# Patient Record
Sex: Female | Born: 1970 | Race: White | Hispanic: Refuse to answer | Marital: Single | Smoking: Never smoker
Health system: Western US, Academic
[De-identification: ages and names within clinical notes are randomized; demographics above are authoritative.]

## PROBLEM LIST (undated history)

## (undated) ENCOUNTER — Emergency Department (HOSPITAL_BASED_OUTPATIENT_CLINIC_OR_DEPARTMENT_OTHER): Admission: EM | Payer: PRIVATE HEALTH INSURANCE | Source: Home / Self Care

## (undated) DIAGNOSIS — F419 Anxiety disorder, unspecified: Secondary | ICD-10-CM

## (undated) DIAGNOSIS — Z5189 Encounter for other specified aftercare: Secondary | ICD-10-CM

## (undated) DIAGNOSIS — D649 Anemia, unspecified: Secondary | ICD-10-CM

## (undated) DIAGNOSIS — R011 Cardiac murmur, unspecified: Secondary | ICD-10-CM

## (undated) HISTORY — DX: Anemia, unspecified: D64.9

## (undated) HISTORY — DX: Anxiety disorder, unspecified: F41.9

## (undated) HISTORY — PX: BREAST REDUCTION SURGERY: SHX8

## (undated) HISTORY — DX: Cardiac murmur, unspecified: R01.1

## (undated) HISTORY — DX: Encounter for other specified aftercare: Z51.89

---

## 1997-07-30 ENCOUNTER — Emergency Department (HOSPITAL_COMMUNITY): Admission: EM | Admit: 1997-07-30 | Discharge: 1997-07-30 | Payer: Self-pay | Admitting: Emergency Medicine

## 1997-09-02 ENCOUNTER — Other Ambulatory Visit: Admission: RE | Admit: 1997-09-02 | Discharge: 1997-09-02 | Payer: Self-pay | Admitting: Obstetrics & Gynecology

## 1999-07-08 ENCOUNTER — Other Ambulatory Visit: Admission: RE | Admit: 1999-07-08 | Discharge: 1999-07-08 | Payer: Self-pay | Admitting: Obstetrics & Gynecology

## 1999-11-19 ENCOUNTER — Encounter (INDEPENDENT_AMBULATORY_CARE_PROVIDER_SITE_OTHER): Payer: Self-pay | Admitting: Specialist

## 1999-11-19 ENCOUNTER — Other Ambulatory Visit: Admission: RE | Admit: 1999-11-19 | Discharge: 1999-11-19 | Payer: Self-pay | Admitting: Plastic Surgery

## 2000-07-08 ENCOUNTER — Other Ambulatory Visit: Admission: RE | Admit: 2000-07-08 | Discharge: 2000-07-08 | Payer: Self-pay | Admitting: Obstetrics & Gynecology

## 2001-08-01 ENCOUNTER — Other Ambulatory Visit: Admission: RE | Admit: 2001-08-01 | Discharge: 2001-08-01 | Payer: Self-pay | Admitting: Obstetrics & Gynecology

## 2001-12-03 ENCOUNTER — Encounter: Payer: Self-pay | Admitting: Emergency Medicine

## 2001-12-04 ENCOUNTER — Observation Stay (HOSPITAL_COMMUNITY): Admission: AD | Admit: 2001-12-04 | Discharge: 2001-12-05 | Payer: Self-pay | Admitting: Obstetrics and Gynecology

## 2002-02-14 ENCOUNTER — Ambulatory Visit: Admission: RE | Admit: 2002-02-14 | Discharge: 2002-02-14 | Payer: Self-pay | Admitting: Obstetrics and Gynecology

## 2002-03-07 ENCOUNTER — Ambulatory Visit: Admission: RE | Admit: 2002-03-07 | Discharge: 2002-03-07 | Payer: Self-pay | Admitting: Obstetrics and Gynecology

## 2002-03-07 ENCOUNTER — Encounter (INDEPENDENT_AMBULATORY_CARE_PROVIDER_SITE_OTHER): Payer: Self-pay

## 2002-07-19 ENCOUNTER — Emergency Department (HOSPITAL_COMMUNITY): Admission: EM | Admit: 2002-07-19 | Discharge: 2002-07-19 | Payer: Self-pay | Admitting: Emergency Medicine

## 2004-05-10 ENCOUNTER — Emergency Department (HOSPITAL_BASED_OUTPATIENT_CLINIC_OR_DEPARTMENT_OTHER): Admitting: Emergency Medicine

## 2004-05-10 ENCOUNTER — Emergency Department (INDEPENDENT_AMBULATORY_CARE_PROVIDER_SITE_OTHER): Payer: Self-pay

## 2004-05-10 NOTE — ED Notes (Signed)
============================== ADMIT SUMMARY ==============================    RECEIVING NURSE -      ED NURSE -     +------------------------------- ALLERGIES -------------------------------+   Sulpha    +-------------------------- ADMITTING DIAGNOSIS --------------------------+   allergic reaction    +--------------------------- ADMITTING SERVICE ---------------------------+    +------------------------ MOST RECENT VITAL SIGNS ------------------------+  BP - 105/62 PULSE - 100   RESPIRATIONS - 18 O2 SAT - 98   TEMPERATURE - MODE -   GCS TOTAL - 15   PAIN - 0 PAIN QUALITY - N/A   PAIN LOCATION - n/a     +-------------------------------- FLUIDS ---------------------------------+  DATE TIME IV FLUID L/R LOCATION SIZE HUNG ABSORBED  ---------------------------------------------------------------------------     TOTAL IV: 0 ml    TOTAL OUTPUT: 0 ml TOTAL PO: 0 ml    +------------------------------ MEDICATIONS ------------------------------+  DATE TIME MEDICATION VERIFYING RN RN INIT  ---------------------------------------------------------------------------    04/16 0507 DIPHENHYDRAMINE 50 Milligrams TLC   PO-(TAB)  04/16 0640 FAMOTIDINE 20 Milligrams PO TLC    +------------------------------- LABS DONE -------------------------------+  ACT- MD MD RN AP +INITIALS+  IVE DATE TIME TIME TIME TREATMENT ORDERS MD RN AP   ---------------------------------------------------------------------------      EKG DONE - NO    +---------------------------- PROCEDURE NOTES ----------------------------+    +------------------------ OTHER NURSING PROCEDURES -----------------------+     +--------------------------- PSYCHOSOCIAL NEEDS --------------------------+  +------------------------- BARRIERS TO LEARNING --------------------------+    ASSESSMENT- Assessment Done with Findings of:      BARRIERS-    No Barriers  SUPPORT PERSON- HUSBAND      SPECIAL CONSIDERATIONS-      ============================== TRIAGE RECORD  ==============================    CHIEF COMPLAINT- Allergic Reaction    TIME OF ONSET- 1 DAY : +-STANDING-+ +--SEATED--+  TRIAGE CATEGORY- 2 : BP PULSE BP PULSE   ROOM- T1 : N/A/N/A N/A 128/81 101  MODE OF ARRIVAL- Car :   IN CUSTODY- No : TEMP MODE O2SAT RESP LMP   PRIVATE MD- M. LOOK : 98.1 Oral 100 16 03/19/04       :   WORK RELATED INJURY- No : +--GCS--+ +--PUPILS--+  RETURN IN 72 HOURS- None : E V M TOT L R RESPONSE  TRIAGE NURSE- Dave Flores : 4 5 6 15  X X N/A     IS THIS VISIT RELATED TO ASSAULT OR DOMESTIC VIOLENCE- NO   PAIN TYPE- V NOW- 7 TOLERABLE AT- 4 QUALITY- Constant      PAIN LOCATION- BIL AXILLA RADIATES TO- BODY   LATEX ALLERGY FORM- No LATEX ALLERGY- No TETANUS- N/A   IMMUNIZATION- UTD PED HEIGHT- N/A WEIGHT- N/A KG  ADDITIONAL FORMS- No   +------------------------- CARE PRIOR TO ARRIVAL -------------------------+   TOOK CLARITIN  +------------------------------- ALLERGIES -------------------------------+   Sulpha  +------------------------------ MEDICATIONS ------------------------------+   PRE-NATAL VIT, CLARITIN, AVENO  +-------------------------- PAST MEDICAL HISTORY -------------------------+   ALLERGIC REACTION TO SULFA GAVE HER THE SAME REACTION.  +---------------------------- CURRENT HISTORY ----------------------------+   C/O EYES SWELLING, RASH TO HER ENTIRE BODY. STATED SHE IS 9.[redacted] WKS   PREGNANT. DENIES OF SOB. C/O BEING UNABLE TO SLEEP.    =========================== REASSESSMENT VITALS ===========================   R T M I   E E O N   S M D O2 PUPILS +---GCS----+ I  DATE TIME BP HR P P E SAT L R E V M TOT POSITION T  ---------------------------------------------------------------------------    04/16 0445 128/81 101 16 98.1 O 100 4 5 6 15  Seated DXF  04/16 0445 / Standing DXF  04/16 0705  105/62 100 18 98 4 5 6 15  Lying TLC     +-----------------PAIN-----------------+   T       Y N T       P O O      DATE TIME E W L LOCATION QUALITY RADIATES COMMENT       ---------------------------------------------------------------------------    04/16 0445 V 7 4 BIL AXILLA Constant BODY Triage  04/16 0445 Triage  04/16 0705 V 0 0 n/a N/A n/a     ============================= NURSE DISCHARGE =============================    DISCHARGE NURSE- French Ana Contizano   DISPOSITION- Seen & Treated WITH- Family   ACCOMPANIED BY- N/A   EQUIP W/TRANSPORT-    N/A   TIME OF DISPOSITION- 05/10/2004 1610 LEFT ED VIA- Ambulate   TEANSFERRED TO- N/A REASON- N/A   ADMITTED TO- N/A ROOM- N/A   NURSE REPORT TO- N/A REPORT TIME- N/A   BELONGINGS- With Patient ENVELOPE NUMBER- N/A   CONDITION ON DISCHARGE- Stable   AFTERCARE PROVIDED WITH- Written and Verbal   WHAT AFTERCARE INSTRUCTIONS WERE GIVEN AND REVIEWED WITH PATIENT  AND/OR FAMILY?- (see EPIC instructions)   allergic dermatitis, benadryl, pepcid   IN WHAT LANGUAGE WERE THESE GIVEN?- English OTHER: N/A   TRANSLATED BY- N/A OTHER: N/A   GCS- E: 4 V: 5 M: 6 TOTAL: 15   PAIN LEVEL UPON DISPOSITION- 0 OUT OF 10  EXPECTED OUTCOMES MET- Yes   WHAT MEDS WERE PROVIDED FROM DISCHARGE PYXIS?-    N/A   RX TO BE FILLED FOR- N/A   DID THE PATIENT OR RESPONSIBLE CARE PROVIDER UNDERSTAND THE FOLLOW UP  RECOMMENDATION?- Yes DISPOSITION BY- RN        ============================== POINT OF CARE ==============================    OCCULT BLOOD STOOL RESULTS   Norm results neg.  DATE TIME RESULTS DONE BY CONTROL POSTIVE CONTROL NEGATIVE     URINE PREGNANCY TEST   Norm results for non pregnant females neg.  DATE TIME RESULTS DONE BY     URINE DIP   Norm results - All neg. with pH 4.6 to 8.0 and urobili 0.1 to 1.0   LEUKO NI- PRO- GLU- URO-   DATE TIME CYTE TRITE PH TEIN COSE KETONES BILI BILI BLOOD BY     FINGER STICK GLUCOSE   Norm results 60 to 110 mg/dl  DATE TIME RESULTS DONE BY     FINGER STICK HEMOGLOBIN   Norm results adult female 59 to 17 gm/dl  Norm results adult female 25 to 16 gm/dl  DATE TIME RESULTS DONE BY     ============================== MD NOTES H&P  ===============================    TIME OF NOTE WRITTEN- N/A      CHIEF COMPLAINT- N/A        HISTORY OF PRESENT ILLNESS  04/16 0517 Marcy Salvo, MD Attending     34 yo f at 7.[redacted] wks ega comes to ed for eval of rash. states   has been progressively worse since yesterday. red, raised,   itchy. now on almost all parts of body. palms/soles/mucous   membranes spared. no fevers. no problems breathing. no new   medications. no new soaps/topical products. no ill contacts.   has taken two doses of claritin as instructed by md in last   24 hours w/o relief. also using aveeno baths w/o relief.      PAST MEDICAL/SURGICAL HISTORY  04/16 0517 Marcy Salvo, MD Attending     allergic to sulfa      FAMILY HISTORY- not elicited   SOCIAL  HISTORY- married   OTHER- not elicited   REVIEW OF SYSTEMS- All other systems reviewed and are negative.     PHYSICAL EXAM  04/16 0517 Marcy Salvo, MD Attending     vs noted.   o2 sat 100% on ra, nl.   nad.   op clear.   neck supple.   ctab.   maculopapular rash scattered about entire body. most   prominent across upper back/shoulders, eyelids, antecubital   fossae.      IMPRESSION  04/16 0517 Marcy Salvo, MD Attending     allergic reaction, unknown cause. no respiratory compromise.      MEDICAL DECISION MAKING  04/16 0517 Marcy Salvo, MD Attending     will give benadryl and reassess.    CASE PRESENTED TO- Marcy Salvo     ============================= PHYSICIAN NOTES =============================    04/16 9147 Marcy Salvo, MD Attending     pt w/ mild improvement of rash, mild improvement of itching,   able to get some sleep while here. no progression of sx. no   respiratory sx. will d/c home to continue benadryl. may take   another dose of pepcid tomorrow if not improved. to f/u w/   pmd.      ============================= PROCEDURE NOTES =============================

## 2004-05-10 NOTE — ED Notes (Signed)
=================================   ORDERS ==================================    ACT- MD MD RN AP +INITIALS+  IVE DATE TIME TIME TIME TREATMENT ORDERS MD RN AP   ---------------------------------------------------------------------------     04/16 0506 0507 0525 DIPHENHYDRAMINE 50 Milligrams MH TLC KSW   PO-(TAB)   04/16 0638 0639 FAMOTIDINE 20 Milligrams PO MH TLC    04/16 0702 0703 d/c home MH TLC    dx allergic reaction   benadryl 50 mg q8h prn (over   thecounter)   pepcid daily (over the counter) if   not better tomorrow   f/u pmd   04/16 0702 0703 Discharge MH TLC

## 2004-05-10 NOTE — ED Notes (Signed)
================================   LAB NOTES ================================      ================================= IMAGES ==================================      ============================== MD DISCHARGE ===============================    DISCHARGE PHYSICIAN- Marcy Salvo   CHIEF COMPLAINT- N/A CASE PRESENTED TO- Marcy Salvo   CONDITION OF DISCHARGE- Stable   WAS THIS VISIT FOR A WORK RELATED ILLNESS OR INJURY- No      PRIMARY CARE PHYSICIAN- MICHELLE LOOK   HAS PCP BEEN CONTACTED- No H&P NOTE WAS DICTATED- No   DISCHARGE DIAGNOSIS-    allergic reaction     DISCHARGE INSTRUCTIONS    04/16 0702 Adela Glimpse, RN     PHYSICIAN- Marcy Salvo, MD Attending FOLLOW-UP (DAYS)- as needed    APPOINTMENT- No RETURN TO- N/A LANGUAGE- English    INSTRUCTIONS-   ALLERGIC DERMATITIS    MEDICATIONS-   ANTIHISTAMINES    GASTRIC ACID BLOCKERS    REFERRAL CLINICS-   REFERRAL PHYSICIANS-   ADDITIONAL INSTRUCTIONS-   Follow up with primary doctor if symptoms persist. Return to   ER if   symptoms worsen.      ============================= FOLLOW UP NOTES =============================

## 2004-05-10 NOTE — ED Notes (Signed)
==============================   ATTENDING NOTE =============================    04/16 0703 Marcy Salvo, MD Attending     see h&p

## 2004-07-14 ENCOUNTER — Other Ambulatory Visit (HOSPITAL_BASED_OUTPATIENT_CLINIC_OR_DEPARTMENT_OTHER): Payer: Self-pay | Admitting: Specialist

## 2004-07-23 ENCOUNTER — Encounter (INDEPENDENT_AMBULATORY_CARE_PROVIDER_SITE_OTHER): Payer: Self-pay | Admitting: Obstetrics & Gynecology

## 2004-07-31 NOTE — Progress Notes (Signed)
 CLINIC: REPRODUCTIVE MEDICINE    REPORT TYPE: CONSULT    Dictating Practitioner: Glenard Haring. Christell Constant, M.D.    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 Naponic, M.D.  [address]  [place], [place] [address]    Dear Dr. Atilano Median:    We saw Carrie Ellison today for the above indication. 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 This gave a  Down syndrome risk of 1 in 50 with a trisomy 18 risk of less than 1 in  10,000.    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 After appropriate counseling the  patient requested amniocentesis and this was performed without incident.  The patient elected FISH evaluation that has returned negative for  trisomies 79, 18 and 13. The final amniocentesis result from cell culture  is not yet available.    I met with this family today to discuss the prognosis with cystic hygroma  and to outline their options.    The outline of the data provided to them in a 40 minute discussion included  the following:  1. 60 to 70% of cases of cystic hygroma are associated with aneuploidy.  2. Of the euploid cases with cystic hygroma, 2/3 are associated with a   major structural abnormality.  3. Of the remaining euploid infants with structural abnormalities, at least   2/3 die in utero or in the neonatal period.    Thus the intact survival likelihood with a fetus with cystic hygroma is  approximately 5%.    However, because of the high likelihood of the karyotype returning normal  and while a fetal cardiac echo has not yet been completed, it is fairly  likely there is no structural abnormality, this leaves her with  approximately a 30% chance of intact survival.  1. However, of the "intact survivors," an unknown percentage are associated   with Noonan's syndrome which involves pulmonary stenosis and   hypertension, hearing deficits  and IQ deficits. The average IQ of   Noonan's children is approximately 63 although a fair proportion are in   the normal range.  2. Approximately 1/2 of Noonan's syndrome children can be diagnosed from a   specific gene defect and should the chromosomes return normal from the   amniocentesis, we will work the patient up for this as well as 18q-   deletion.  3. Thus, even given the approximately 30% chance of intact survival with   normal structure and karyotype, the chance of developmental delay is   significant.    At the conclusion of my visit with Carrie Ellison and her husband today, they  indicated that they may consider pregnancy termination. To assist them  with their decision making, I have asked them to return in approximately  one week whereupon we will reassess the size of the nuchal cystic structure  and in combination with the completed karyotype provide final counseling.      A fetal cardiac echogram has been tentatively scheduled for approximately  July 20.    I left a message in your office today. I am hoping to speak with you  tomorrow regarding this matter.    Sincerely,      Job #: [id number]          Signature Derived From Controlled Access Password  [place] R. Christell Constant,  M.D. 07/31/2004 08:27      DD: 07/23/2004 DT: 07/25/2004 1:00 P DocNo.: [id number]  [id number]      Primary Care Physician:  Lelon Mast  [address], [place] [address]    cc:

## 2004-08-05 ENCOUNTER — Ambulatory Visit (HOSPITAL_BASED_OUTPATIENT_CLINIC_OR_DEPARTMENT_OTHER)

## 2004-08-05 ENCOUNTER — Other Ambulatory Visit (INDEPENDENT_AMBULATORY_CARE_PROVIDER_SITE_OTHER): Payer: Self-pay | Admitting: Obstetrics

## 2004-08-06 ENCOUNTER — Ambulatory Visit (HOSPITAL_BASED_OUTPATIENT_CLINIC_OR_DEPARTMENT_OTHER)

## 2004-08-07 ENCOUNTER — Ambulatory Visit

## 2004-08-07 ENCOUNTER — Encounter (INDEPENDENT_AMBULATORY_CARE_PROVIDER_SITE_OTHER): Payer: Self-pay | Admitting: Obstetrics & Gynecology

## 2004-08-10 NOTE — Op Note (Signed)
 Dictating Practitioner: Houston Siren, M.D.     Staff Physician: Christell Constant. Woelkers, M.D.    Date of Operation: 08/07/2004    PREOPERATIVE DIAGNOSIS: 1 Intrauterine pregnancy at 20+1 weeks; 2. Fetal  cystic hygroma; 3. Desires elective pregnancy termination  POSTOPERATIVE DIAGNOSIS: 1 Intrauterine pregnancy at 20+1 weeks; 2. Fetal  cystic hygroma; 3. Desires elective pregnancy termination  OPERATION: Ultrasound-guided dilation and evacuation.  SURGEON/STAFF: D. Woelkers ASSISTANT: G. Reggiardo    ANESTHESIA: General endotracheal.  ESTIMATED BLOOD LOSS: 200 ml.  FLUIDS: 2300 ml of lactated Ringer's.    INDICATIONS: A 34 year old gravida 1 at 20 weeks and 1 day with a fetus  with a cystic hygroma. The patient has had a karyotype by [facility] which had  been negative but, given the high incidence of adverse outcomes with this  malformation, the family had elected for termination.    DESCRIPTION OF PROCEDURE: The patient was taken to the operating room  where general endotracheal intubation was placed and found to be adequate.  The patient was examined. Two sponges and three large laminaria and five  small laminaria were collected from the vagina. The vagina was then  prepped in the normal sterile fashion.    Under ultrasound guidance, a speculum was inserted and the anterior lip of  the cervix was grasped with a single-tooth tenaculum. A paracervical block  was applied with 0.5% lidocaine 10 ml and 8 units of pitressin. The  uterine cervix was dilated under direct visualization with an 18 Hegar. A  16 mm cannula was used to attempt to rupture the membrane. Inadequate  suction was observed and Sopher forceps were used to then rupture the  membrane. The cannula was then used to evacuate the fluid contents of the  uterus. Sopher forceps were then used under ultrasound guidance to  directly observe the removal of the products of conception. A sharp curet  was then used to curettage the inside of the endometrium and  remove all  remaining placental products. Uterine cry was obtained and the endometrium  had a good stripe at the end of the procedure.    All instruments were removed. The uterus was massaged and the patient was  given Pitocin. Again the speculum was inserted and the cervix was  visualized with no lacerations or damage. The uterus was once again  massaged and all instruments were removed.    The patient will be discharged with Methergine 0.2 mg p.o. q.4h. p.r.n.  bleeding; doxycycline 100 mg b.i.d. x3 days; ibuprofen 600 mg q.6-8h.  p.r.n., which the patient already has; and the patient also has a previous  order for Vicodin, which she has been using minimally since the laminaria  placement. The patient will follow up in six weeks and was given  precautions.    All sponge, lap and needle counts were correct x2. Dr. Marlynn Perking, the  Attending, was present and scrubbed in for the entire procedure.                Signature Derived From Controlled Access Password  Douglas A. Woelkers, M.D. 08/10/2004 08:35    DD: 08/07/2004 DT: 08/07/2004 9:59 A DocNo.: [id number]  GER/cec       Primary Care Physician:   Lelon Mast   [address], [place] [address]    cc:

## 2004-09-07 NOTE — Lab (Addendum)
PRIMARY:  PREMATURE DISRUPTED FEMALE FETUS ESTIMATED GESTATIONAL AGE [redacted] WEEKS  CYSTIC HYGROMA   PLACENTA - EDEMATOUS VILLI  CLINICAL SUMMARY:  This is a collection of fetal parts received after elective termination of  a 19-week gestation for cystic hygroma. The mother is 68 years old,  unknown gravida status. Dating is by last menstrual period. Cystic  hygroma was initially diagnosed 07/16/04 and was again seen on ultrasound  07/30/04. The fetus was identified as female, which was subsequently  confirmed by karyotyping: normal 46XX fetus.  CLINICO-PATHOLOGICAL CORRELATION:  There are relatively few pathologic findings which attest to fetal  compromise. The only abnormal finding is edematous villi within the  placenta and mild evidence of fetal hydrops, with an absence of normal  nucleated RBCs within the fetal vessels of the placenta. The cause of the  cystic hygroma in this case is not known and there is no evidence of Turner  syndrome or other chromosomal anomalies. The karyotype showed normal 46XX.  Other causes of hydrops include Rh factor isoimmunization,  hemoglobinopathies, cardiac or other AV malformations, chromosomal  abnormalities, and intrauterine infections including CMV and Parvovirus  B19. The mother is Rh+, and there is no familial hx of G6PD or thalassemia.  There was no grossly observed cardiac, pulmonary, or AV malformation and  all organs appear to be in their gestational age appropriate stage of  development. The edematous villi indicate likely placental compromise, but  there is no evidence of chorioamnionitis. Furthermore there is no evidence  of CMV or parvovirus infection, with an absence of nuclear or cytoplasmic  inclusions in either the placental tissue or fetal organs and normal  extramedullary hematopoiesis.   1) Dewaine Conger Perinatal Pathology 2nd ed. [place], [place].  [employer] 1996, pp. [phone], 120- 124, 131-157.  This case was presented at [facility] on  August 12, 2004  and consulted with Dr. Hermelinda Medicus.  Weights and Measurements  It is most reliable to give proportionate weight for infants' organs by  relying upon the crown-rump (CR) measurements, the next most reliable is to  use the foot length measurements, and the least reliable are standards  derived from the infant's weight. The standards here printed rely upon the  data given for that parameter marked in the left column. Data compared  with table or E. L. Potter & J. M.G. Tasia Catchings, Pathology of the Fetus and The  Infant, Year [employer], [employer]. [phone]. Cord length from:  R.L.Naeye: Umbilical cord length: Clinical Significance. J. Ped.  782:956-213, 1985. Placental weight from: D. Everardo Pacific and Purcell Mouton:  Tolerance Limits for Fetal and Placental Growth Relationships. J. Sela Hua. Brit. Cwlth. 78:620-623,1971.   Expected Actual  CR length 15.0 cm  CH length 22.0 cm  Foot length 2.9 cm 2.8 cm  Weight 200.0 g 152.0 g  Gestational age [redacted] wks  Heart 1.7 g  Lungs 7.2 g  Liver 12.0 g  Spleen 0.4 g  Adrenals (2) 1.1 g  Kidneys (2) 2.2 g  Thymus 0.4 g  Brain 33.0 g  Small bowel  Large bowel  Placenta number  Placenta weight 155.0 g 85.0 g  Cord length 31.0 cm 19.0 cm  Head circumference  Chest circumference  Abdomen circumference  Inner canthal distance     GROSS DESCRIPTION:  EXTERNAL EXAMINATION:  Multiple fetal parts are received. These weigh 152gm in aggregate. The  foot length is 2.8 cm. The left leg and foot; the right leg, foot, and  right hemipelvis;  the right forearm and hand; and the left arm and shoulder  are identified. There are normally formed five fingers and toes without  syndactyly. There is no single palmar crease. The right face and cranial  vault is identified. No ears are identified. A normally formed nose and  single eye is identified. Multiple rib and thoracic sections are  identified. A section of abdominal tissue is identified. There is a  patent anus. No external genitalia  are identified.  INTERNAL EXAMINATION:  CARDIOVASCULAR SYSTEM:  A hemisected heart is identified with normal red-brown myocardium.  RESPIRATORY SYSTEM:  The larynx is identified. A single tan-pink lung is identified.  HEMATOPOIETIC SYSTEM:  A red-brown spleen and an attached tan-yellow lobulated pancreas is  identified.  GASTROINTESTINAL SYSTEM:  The stomach is identified. Multiple lengths of small and large bowel are  identified.  HEPATOBILIARY SYSTEM:  A disrupted section of deep red and soft liver is identified. No  gallbladder identified.  REPRODUCTIVE SYSTEM:  An apparently normal uterus is identified. No external genitalia are  identified.  URINARY SYSTEM:  A single lobulated pink kidney is identified.  ORGANS OF THE NECK:  The thymus is identified and is yellow-tan and lobulated.  PLACENTA:  85 grams in aggregate of disrupted placental tissue is identified. 19 cm  of umbilical cord is identified. On cross sectioning, this reveals a three  vessel cord.  CASSETTE SUMMARY:  A1. Thymus, Spleen, Pancreas  A2. Placenta, Lung  A3. Placenta  A4. Placenta, Umbilical Cord, Bowel  A5. Liver  A6. Larynx  A7. Stomach, Kidney  A8. Rib  A9. Bladder  A10. Eye  MICROSCOPIC DESCRIPTION:  CARDIOVASCULAR SYSTEM:  Sections of the heart reveal unremarkable myocardium, and heart chambers  are filled with blood. A small number of which contain nucleated red cells  consistent with fetal blood. There is normal glycogenation of myocardial  cells.  RESPIRATORY SYSTEM:  Sections of lung reveal hypoplastic alveoli. There are no air-filled  spaces. This is consistent with a canalicular stage of development. The  bronchioles contain no polymorphonuclear cell or necrotic debris.  GASTROINTESTINAL SYSTEM:  Sections from the esophagus show no erosion and continue to have  pseudociliated cell of embryologic origin. Sections from the stomach  reveal normal mucosa and absence of polymorphonuclear cells. Sections from  small and large intestine  reveal normal mucosa.  HEPATOBILIARY SYSTEM:  Sections from liver demonstrate extramedullary hematopoiesis and no  significant histopathology.  PANCREAS:  Sections of the pancreas show unremarkable ducts and acinar structures.  They are normally formed islets of Langerhans, which are not enlarged and  do not abut.  URINARY SYSTEM:  Sections of kidney demonstrate normal subcortical glomerulopoiesis. Renal  tubules and renal pelves are unremarkable. The bladder reveals normal  mucosa.  HEMATOPOIETIC SYSTEM:  Sections of thymus and spleen show no significant histopathology.  REPRODUCTIVE SYSTEM:  A small section of uterus is identified microscopically and is normal.  There is a small section of normal fallopian tube with remnants of the  Wolffian and duct identified. No ovary is identified.  ENDOCRINE SYSTEM:  No pituitary or adrenal glands are identified.  ORGANS OF HEAD AND NECK:   A section of normally formed mandible and associated submandibular  salivary gland is identified. Sections of the eye reveal normal  development and no histopathology.  CENTRAL NERVOUS SYSTEM:  Sections of disrupted brain reveal normal glial and neuronal development  and separation without an significant histopathology.  PLACENTA:  Sections from the umbilical cord reveal a normal three-vessel  cord without  histopathology. Sections of membrane reveal no evidence of  chorioamnionitis. Sections from the placenta reveal no microinfarcts.  There are edematous villi with evidence of fetal hydrops. No nucleated red  cells are identified in the fetal vessels. There are no cytoplasmic or  intranuclear inclusions present. There is a single segment of amnion  containing two clear filled cyst estimated to be 0.1mm in diameter.  CONFIDENTIAL HEALTH INFORMATION: Health Care information is personal and  sensitive information. If it is being faxed to you it is done so under  appropriate authorization from the patient or under circumstances that do  not  require patient authorization. You, the recipient, are obligated to  maintain it in a safe, secure and confidential manner. Re-disclosure  without additional patient consent or as permitted by law is prohibited.  Unauthorized re-disclosure or failure to maintain confidentiality could  subject you to penalties described in federal and state law. If you have  received this report or facsimile in error, please notify the [facility] immediately and destroy the received document(s).   Material reviewed and Interpreted and  Report Electronically Signed by:  Wilkie Aye M.D. [phone])  Attending Pathologist  09/07/04 12:32  Electronic Signature derived from a single  controlled access password

## 2004-09-21 ENCOUNTER — Encounter (HOSPITAL_BASED_OUTPATIENT_CLINIC_OR_DEPARTMENT_OTHER): Admitting: Obstetrics & Gynecology

## 2004-12-01 ENCOUNTER — Ambulatory Visit (INDEPENDENT_AMBULATORY_CARE_PROVIDER_SITE_OTHER): Admitting: Hematology/Oncology

## 2005-04-15 ENCOUNTER — Other Ambulatory Visit: Payer: Self-pay | Admitting: Specialist

## 2005-04-19 ENCOUNTER — Encounter (INDEPENDENT_AMBULATORY_CARE_PROVIDER_SITE_OTHER): Payer: Self-pay | Admitting: Maternal & Fetal Medicine

## 2005-04-19 ENCOUNTER — Ambulatory Visit (INDEPENDENT_AMBULATORY_CARE_PROVIDER_SITE_OTHER)

## 2005-04-19 ENCOUNTER — Ambulatory Visit (INDEPENDENT_AMBULATORY_CARE_PROVIDER_SITE_OTHER): Admitting: Maternal & Fetal Medicine

## 2005-04-21 NOTE — Progress Notes (Signed)
CLINIC: REPRODUCTIVE MEDICINE    REPORT TYPE: CONSULT    Dictating Practitioner: Feliz Beam. Sheppard Penton, D.O.    DATE OF SERVICE: 04/19/2005    REASON FOR VISIT: FIRST TRIMESTER SCREENING    ADDRESS: Mearl A. Naponic, M.D.  [address]  [place], [place] [address]    Dear Dr. Atilano Median:    Thank you for referring Carrie Ellison for prenatal evaluation. As you  know, Ms. Lingafelter is a 35 year old gravida 2, para 0, TAB 1, currently at 12  weeks and 2 days by an [facility] of October 30, 2005, who presents for first  trimester screening.    The patient's obstetric history is significant for having a prior pregnancy  complicated by a cystic hygroma for which the patient underwent a D&E in  2006. That pregnancy had an amniocentesis which returned a normal  karyotype and DiGeorge probe. The pathology report identified the cystic  hygroma, but otherwise did not have additional findings to explain the  cystic hygroma. 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 The husband was also  tested and was negative.    On a limited ultrasound today, reported separately, we noted a single fetus  with embryonic demise. The crown-rump length measured 10.2 weeks in size  and there was no cardiac activity present. This was verified on real time  imaging, including color and M-mode imaging.    Given the history of a prior cystic hygroma and now a first trimester  embryonic demise I would consider checking the parental karyotype, though  it is probably normal, and antiphospholipid antibody syndrome labs (lupus  anticoagulant and anticardiolipin antibodies). Unfortunately, as I told  the patient, miscarriage is fairly a common problem (up to 20%) and it may  never be ascertained as to what the cause of this particular loss may have  been. The patient was instructed to contact her physician to arrange  for  surgical evacuation. Again, I would recommend obtaining karyotypes on the  fetal remains to rule out this as a possible etiology for this particular  demise.    At the conclusion of our visit the patient's questions were answered and  she seemed satisfied with the information provided. The majority of time  (greater than 50%) was face-to-face counseling and coordination of care for  this patient. Approximate time was 20 minutes.    If you have any questions regarding this report, please do not hesitate to  call. Thank you again for allowing [place] to participate in this patients  obstetrical care.    Sincerely,      Job #: [id number]          Signature Derived From Controlled Access Password  Basma Buchner B. Sheppard Penton, D.O. 04/21/2005 18:20          DD: 04/19/2005 DT: 04/20/2005 5:00 A DocNo.: [id number]  [id number]    Referring Physician:  Lelon Mast M.D.  [address]  [place] [address]    Primary Care Physician:  Lelon Mast  [address] [facility] [address] [place] [address]    cc:

## 2005-07-21 ENCOUNTER — Inpatient Hospital Stay (HOSPITAL_COMMUNITY): Admission: AD | Admit: 2005-07-21 | Discharge: 2005-07-21 | Payer: Self-pay | Admitting: Obstetrics & Gynecology

## 2005-08-23 ENCOUNTER — Inpatient Hospital Stay (HOSPITAL_COMMUNITY): Admission: AD | Admit: 2005-08-23 | Discharge: 2005-08-23 | Payer: Self-pay | Admitting: Obstetrics & Gynecology

## 2005-08-27 ENCOUNTER — Inpatient Hospital Stay (HOSPITAL_COMMUNITY): Admission: AD | Admit: 2005-08-27 | Discharge: 2005-08-27 | Payer: Self-pay | Admitting: Obstetrics and Gynecology

## 2005-08-28 ENCOUNTER — Inpatient Hospital Stay (HOSPITAL_COMMUNITY): Admission: AD | Admit: 2005-08-28 | Discharge: 2005-08-28 | Payer: Self-pay | Admitting: Obstetrics and Gynecology

## 2005-08-30 ENCOUNTER — Inpatient Hospital Stay (HOSPITAL_COMMUNITY): Admission: AD | Admit: 2005-08-30 | Discharge: 2005-09-03 | Payer: Self-pay | Admitting: Obstetrics & Gynecology

## 2005-11-22 ENCOUNTER — Emergency Department (HOSPITAL_COMMUNITY): Admission: EM | Admit: 2005-11-22 | Discharge: 2005-11-22 | Payer: Self-pay | Admitting: Family Medicine

## 2005-12-11 ENCOUNTER — Emergency Department (HOSPITAL_COMMUNITY): Admission: EM | Admit: 2005-12-11 | Discharge: 2005-12-11 | Payer: Self-pay | Admitting: Emergency Medicine

## 2006-02-22 ENCOUNTER — Encounter (INDEPENDENT_AMBULATORY_CARE_PROVIDER_SITE_OTHER): Payer: Self-pay | Admitting: Gynecology

## 2006-02-28 ENCOUNTER — Other Ambulatory Visit: Payer: Self-pay | Admitting: Specialist

## 2006-03-01 ENCOUNTER — Encounter (INDEPENDENT_AMBULATORY_CARE_PROVIDER_SITE_OTHER)

## 2006-03-01 ENCOUNTER — Encounter (INDEPENDENT_AMBULATORY_CARE_PROVIDER_SITE_OTHER): Payer: Self-pay | Admitting: Gynecology

## 2006-03-01 NOTE — Progress Notes (Signed)
UPDATED INTAKE SUMMARY  Pt had first tri blood drawn 03/01/06

## 2006-04-08 ENCOUNTER — Other Ambulatory Visit: Payer: Self-pay | Admitting: Specialist

## 2006-04-11 ENCOUNTER — Ambulatory Visit (INDEPENDENT_AMBULATORY_CARE_PROVIDER_SITE_OTHER)

## 2006-04-11 ENCOUNTER — Encounter (INDEPENDENT_AMBULATORY_CARE_PROVIDER_SITE_OTHER): Payer: Self-pay | Admitting: Gynecology

## 2006-04-11 ENCOUNTER — Encounter (HOSPITAL_BASED_OUTPATIENT_CLINIC_OR_DEPARTMENT_OTHER): Payer: Self-pay | Admitting: Gynecology

## 2006-04-11 ENCOUNTER — Encounter (INDEPENDENT_AMBULATORY_CARE_PROVIDER_SITE_OTHER)

## 2006-04-11 ENCOUNTER — Institutional Professional Consult (permissible substitution) (INDEPENDENT_AMBULATORY_CARE_PROVIDER_SITE_OTHER): Admitting: Maternal & Fetal Medicine

## 2006-04-11 NOTE — Progress Notes (Signed)
UPDATED INTAKE SUMMARY  DOC IN AM 3/18

## 2006-04-13 NOTE — Progress Notes (Signed)
CLINIC: REPRODUCTIVE MEDICINE    REPORT TYPE: NOTE    Dictating Practitioner: Vicie Mutters. Ethelene Hal, M.D.     Staff Physician: Feliz Beam. Sheppard Penton, D.O.    DATE OF SERVICE: 04/11/2006    REASON FOR VISIT: PREVIOUS HISTORY OF CYSTIC HYGROMA AFFECTING HER FIRST  PREGN    ADDRESS: Mearl A. Naponic, M.D.  Fax Recipient  [address]  [place], [place] [address]    Dear Dr. Atilano Median:    At your request, we had the pleasure of seeing Ms. Carrie Ellison who, as  you recall, is a 36 year old G3, P0-0-2-0. She is currently at 17 weeks  and 5/7 days by clinical dating. She presents for evaluation due to  history of a previous pregnancy affected with a cystic hygroma.    The patient's previous history is significant for her first pregnancy  affected with a cystic hygroma for which she underwent a D&E in 2006. She  had an amniocentesis that pregnancy which returned normal karyotype. The  pathologic evaluation of this pregnancy was limited but did demonstrate a  cystic hygroma. Her next pregnancy was a missed AB at approximately 10  weeks. She came in for her first trimester scan and was noted to have no  cardiac activity. Otherwise, the patient is completely healthy. She is of  Jewish descent and has had a Personal assistant, which revealed that she is a  carrier for Fanconi anemia. However, her husband has previously been  tested and has been shown to be negative.    The patient underwent an ultrasound, which follow under separate cover.  This ultrasound demonstrated a fetus consistent with clinical dating. The  biometrical measurements were symmetrical. A detailed anatomy ultrasound  was without abnormalities. The placenta was noted to be anterior and it  was low lying approximately 1.4 cm from the cervical os. The only  limitation to the ultrasound performed today was an evaluation of the nasal  bone and fetal profile due to fetal positioning. The amniotic fluid index  was normal.    The patient has undergone both first trimester and second  trimester  screening. Her first trimester screening returned with a risk of Down  syndrome of approximately 1:3840 and trisomy 18/13 of 1 in greater than  10,000. Her expanded AFP also returned with a screen-negative for neural  tube defects and a trisomy 21 risk of 1:4600 and trisomy 31 and  Forget-Lemli-Opitz of less than 1:10,000. I did review both of these with  the patient. After counseling, the patient elected to decline  amniocentesis.    In regards to the ultrasound today, we discussed the low-lying placenta.  She will need evaluation at 28 weeks to evaluate placentation though I  suspect that the lower uterine segment will be clear in our next  evaluation. Otherwise, we would recommend a repeat ultrasound only as  clinically indicated.    In summary, in regards to her future management, we recommend a repeat  ultrasound at 28 weeks to evaluate placentation. I did give her bleeding  precautions. At the end of our consultation, all questions were answered.      Thank you for allowing [place] to participate in her care.    Sincerely,      Job #: [id number]          Signature Derived From Controlled Access Password  Norman Bier A. Ethelene Hal, M.D. 04/13/2006 05:06 P    Signature Derived From Controlled Access Password  Richard B. Sheppard Penton, D.O. 04/12/2006 01:48 P  DD: 04/11/2006 DT: 04/12/2006 05:32 A DocNo.: [id number]      Primary Care Physician:  LOOK MICHELLE  [address] [place] [address]    cc::

## 2006-06-29 ENCOUNTER — Emergency Department (HOSPITAL_COMMUNITY): Admission: EM | Admit: 2006-06-29 | Discharge: 2006-06-29 | Payer: Self-pay | Admitting: Emergency Medicine

## 2008-04-09 IMAGING — US US OB COMP +14 WK
1 series · 13 of 28 positions shown · non-contrast
Comparison: none

CLINICAL DATA: 31 weeks pregnant.  Nonreactive NST.

[Series 1: us ob comp +14 wk · 0.29mm/px · 31 acquisitions, 13 frames shown]
[im 2/31]
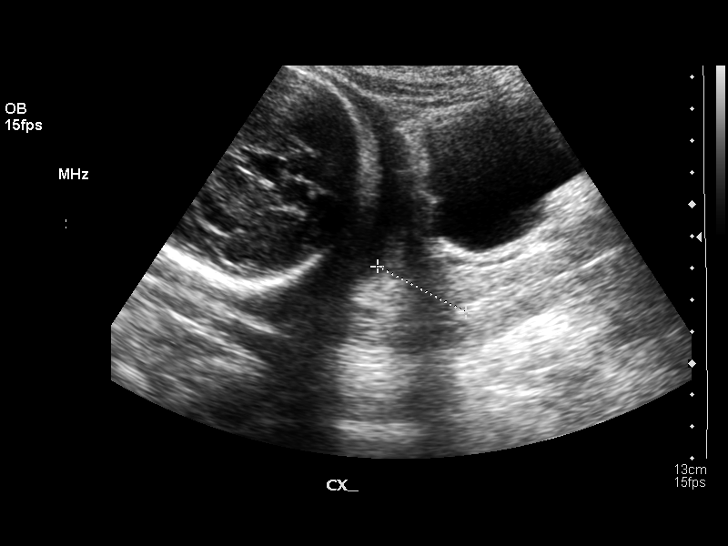
[im 4/31]
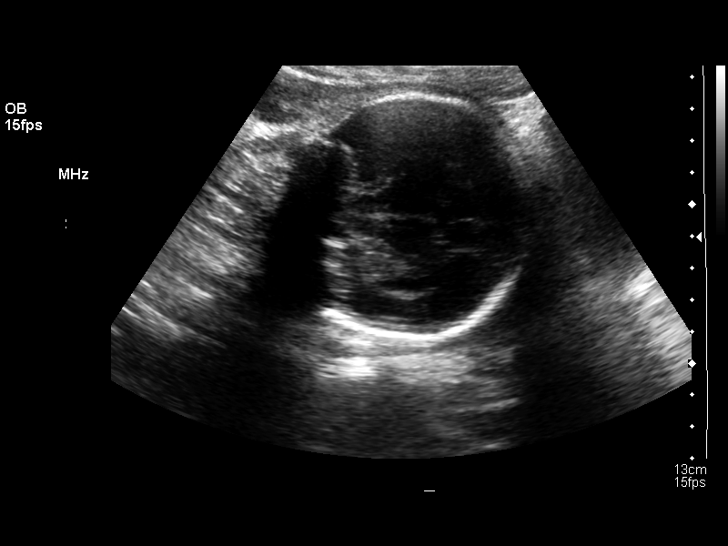
[im 6/31]
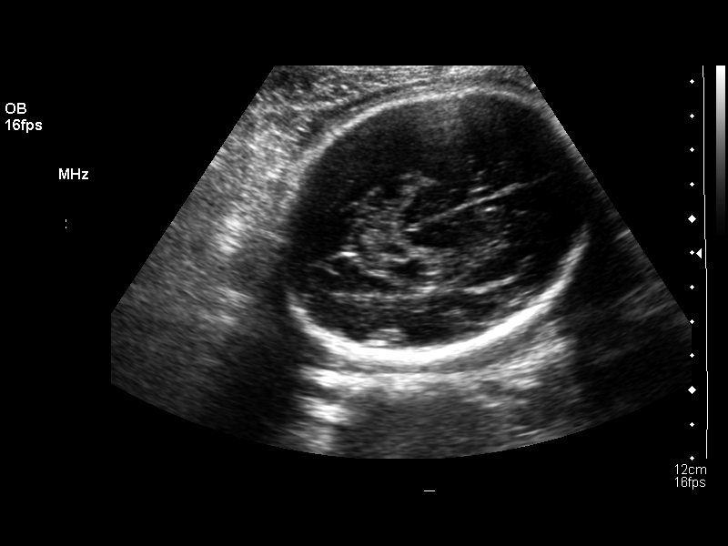
[im 8/31]
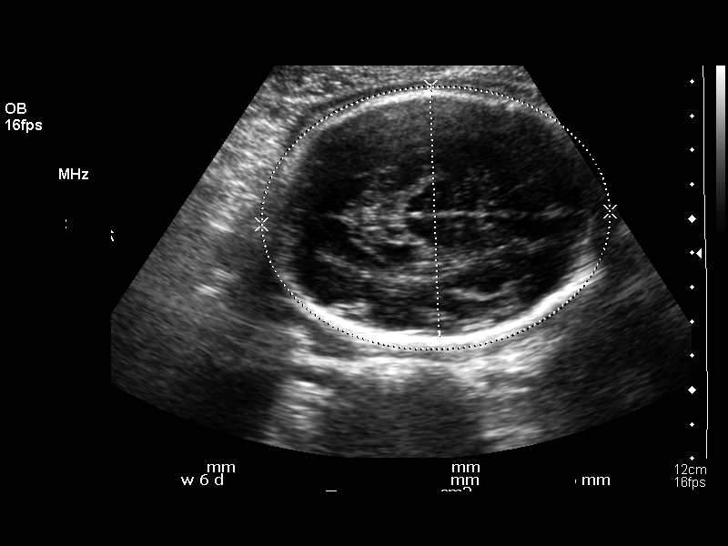
[im 11/31]
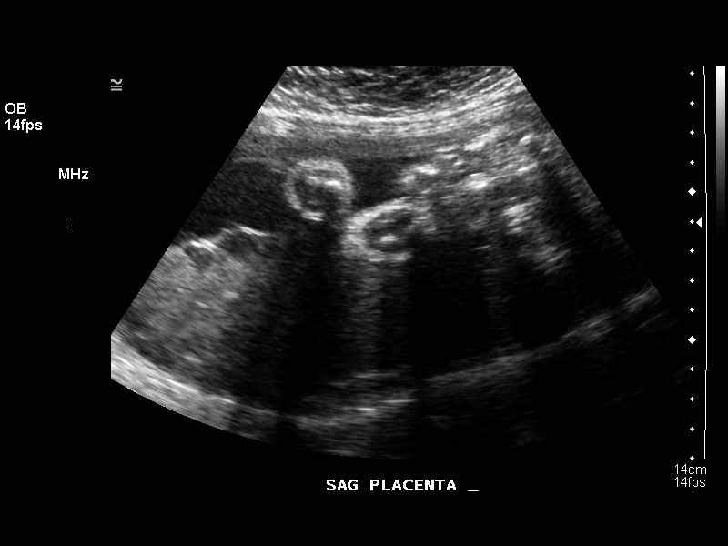
[im 13/31]
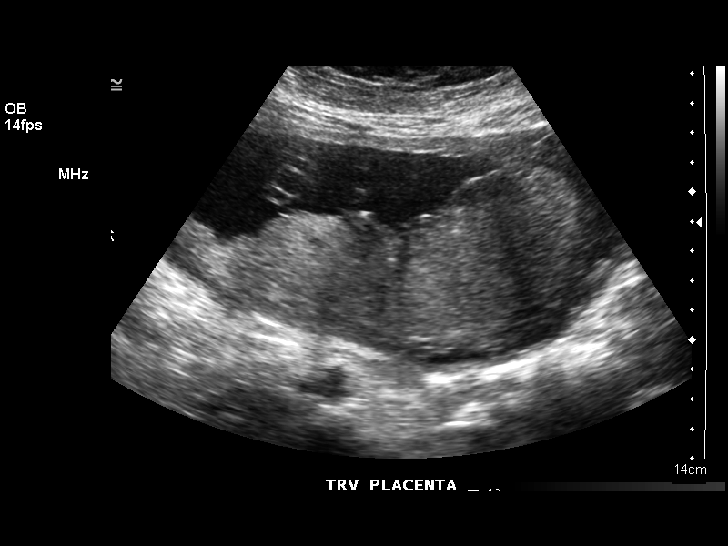
[im 16/31]
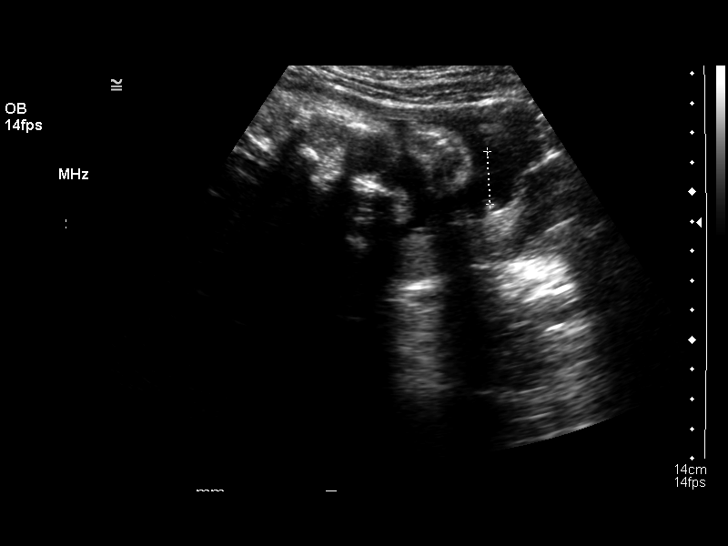
[im 18/31]
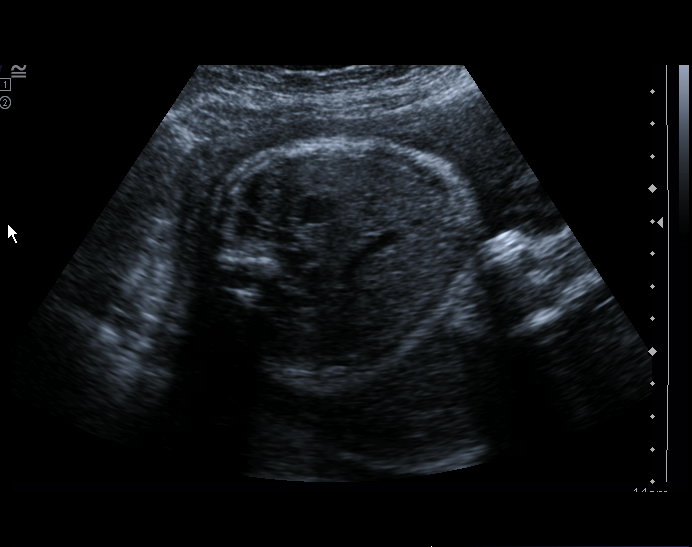
[im 21/31]
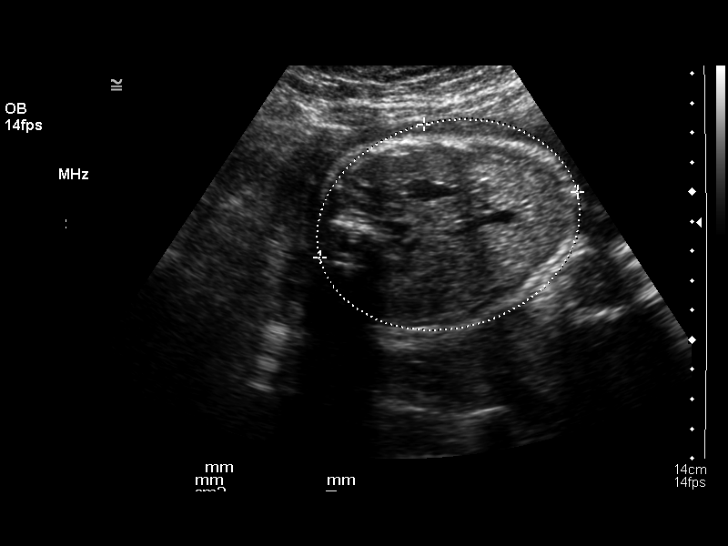
[im 23/31]
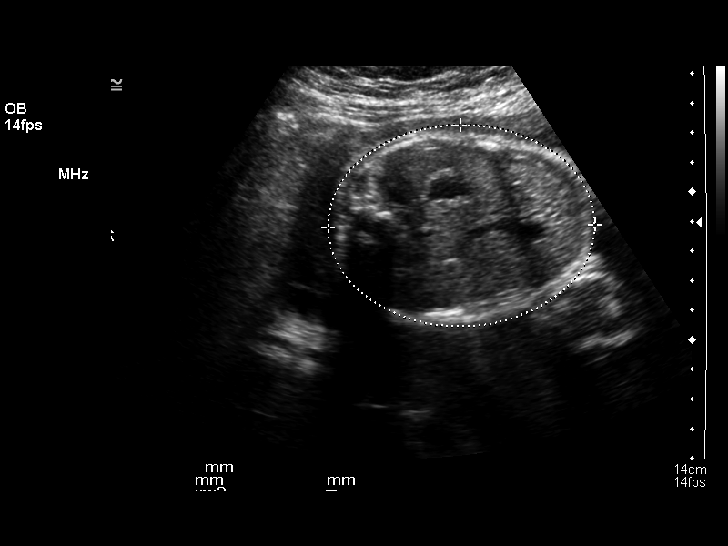
[im 25/31]
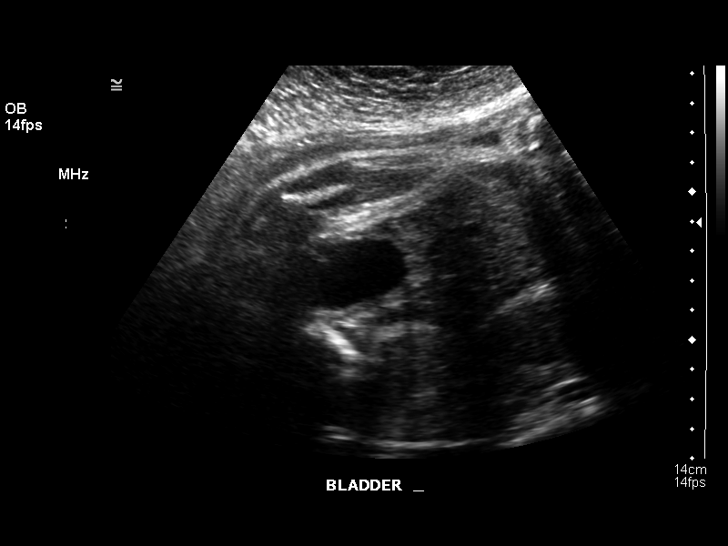
[im 27/31]
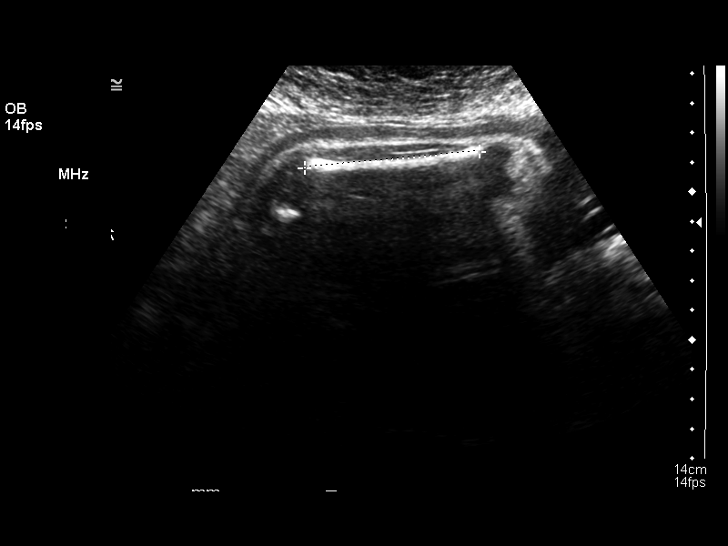
[im 29/31]
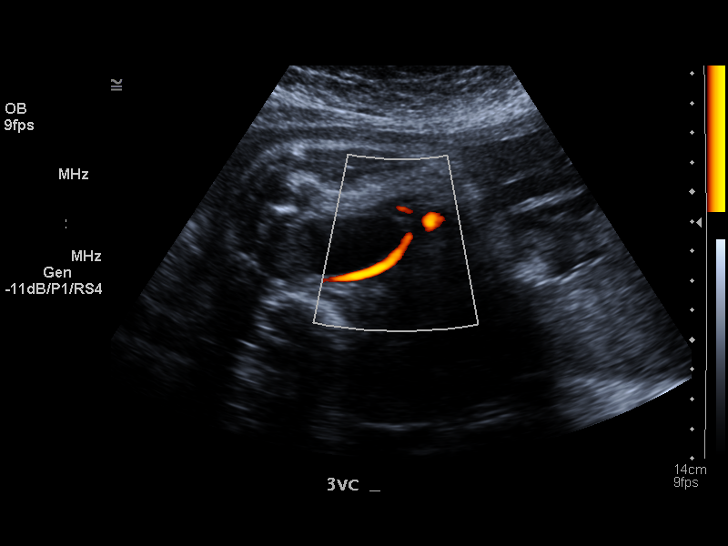

[13 of 28 positions shown; findings below may reference images not displayed]

OBSTETRICAL ULTRASOUND:
 Number of Fetuses: 1
 Heart Rate:  147
 Movement:  Yes
 Breathing:  Yes  
 Presentation:  Cephalic
 Placental Location:  Posterior
 Grade: I
 Previa:  No
 Amniotic Fluid (Subjective):  Normal
 Amniotic Fluid (Objective):   11.2 cm AFI (5th -95th%ile = 8.8 ? 23.8 cm for 31 wks)

 FETAL BIOMETRY
 BPD:   7.3 cm   29 w 3 d
 HC:   28.0 cm   30 w 4 d
 AC:   25.1 cm   29 w 2 d
 FL:   5.9 cm   30 w 5 d

 MEAN GA:  30 w 0 d  US EDC:  09/29/05

 EFW: 3642 g (H) 25th ? 50th%ile (8687 ? 6211 g) For 31 wks

 FETAL ANATOMY
 Lateral Ventricles:    Visualized 
 Thalami/CSP:      Visualized 
 Posterior Fossa:  Not visualized 
 Nuchal Region:    Not visualized 
 Spine:      Not visualized 
 4 Chamber Heart on Left:      Not visualized 
 Stomach on Left:      Visualized 
 3 Vessel Cord:    Visualized 
 Cord Insertion site:    Not visualized 
 Kidneys:  Visualized 
 Bladder:  Visualized 
 Extremities:      Not visualized 

 Evaluation limited by:

 MATERNAL UTERINE AND ADNEXAL FINDINGS
 Cervix: 3.1 cm transabdominally.  

 BIOPHYSICAL PROFILE

 Movement:   2    Time:  20 minutes
 Breathing:  2
 Tone:  2
 Amniotic Fluid:   2

 Total Score:  8
IMPRESSION: 1.  BPP [DATE] in 20 minutes.
 2.  Amniotic fluid subjectively and objectively normal.  
 3.  Cephalic presentation.  Fetal heart rate 147 bpm.

## 2010-06-12 NOTE — Op Note (Signed)
NAME:  Anna Huffman, Anna Huffman                         ACCOUNT NO.:  [id number]   MEDICAL RECORD NO.:  [id number]                   PATIENT TYPE:  INP   LOCATION:  9399                                 FACILITY:  [facility]   PHYSICIAN:  Randye Lobo, M.D.                DATE OF BIRTH:  [birth year]   DATE OF PROCEDURE:  03/07/2002  DATE OF DISCHARGE:                                 OPERATIVE REPORT   PREOPERATIVE DIAGNOSES:  1. Menometrorrhagia.  2. Submucous myoma.   POSTOPERATIVE DIAGNOSES:  1. Menometrorrhagia.  2. Submucous myoma.   PROCEDURE:  Hysteroscopic myomectomy with dilatation and curettage.   SURGEON:  Randye Lobo, M.D.   ANESTHESIA:  LMA, paracervical block with 1% lidocaine.   IV FLUIDS:  900 cc Ringers lactate.   ESTIMATED BLOOD LOSS:  Minimal.   URINE OUTPUT:  100 cc.   COMPLICATIONS:  None.   INDICATIONS FOR PROCEDURE:  The patient is a 40 year old gravida 2, para 1-0-  1-1 African American female who presented to the office with  menometrorrhagia which was uncontrolled by oral contraceptive pills.  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  The patient was doing relatively well when suddenly she developed an  increase of her bleeding from the vagina and her hemoglobin was noted to be  six.  At this time, the patient was hospitalized and treated with two units  of packed red blood cells for symptomatic anemia and she received Depo  Lupron 3.75 mg IM x1.  The patient's bleeding abated and her hemoglobin  remained stable.  The patient also took iron orally.  Her preoperative  hemoglobin measured 11.8 in the office.  A repeat ultrasound on February 13, 2002, documented the submucous fibroid to measure 1.2 cm.  Plans were made  to proceed with a hysteroscopic resection of the submucous fibroid.  The  patient was consented for surgery and agreed to proceed after the risks,  benefits, and alternatives were discussed with her.   FINDINGS:  Examination under anesthesia revealed a small uterus with no  adnexal masses.  Hysteroscopy demonstrated a 1.5 cm multilobular left-sided  mid fundal submucous fibroid. There was no evidence of endometrial polyps.  The endometrium, however, appeared to be fluffy and thickened throughout.  The right cornua region was identified well and the left was not seen well  due to the nature of the endometrium.  Please note that the fibroid was not  located in the cornual region.   SPECIMENS:  The submucous fibroid was sent to pathology in fragments.  Endometrial curettings were also sent.   DESCRIPTION OF PROCEDURE:  With an IV in place, the patient was taken to the  operating room after she was properly identified.  The patient did receive  Ancef 1 g intravenously for antibiotic prophylaxis.  The patient did receive  TED hose and PAS stockings for  DVT prophylaxis.  In the operating room, the  patient was placed in the supine position and LMA anesthesia was induced.  The patient was then placed in the dorsal lithotomy position and the vagina  and perineum were sterilely prepped and draped.  Foley catheter was  sterilely placed inside the bladder.   The speculum was placed inside the vagina and single-tooth tenaculum was  placed on the anterior cervical lip.  A paracervical block was performed  with 10 cc of 1% lidocaine in standard fashion.  The uterus was then sounded  to 8 cm and the cervix was then sequentially dilated with Shawnie Pons dilators up  to a #29.  The resectoscope was then inserted into the uterine cavity under  the continuous infusion of 3% sorbitol.  The findings were as noted above.  The submucous myoma was then resected with the loop and a cutting setting of  80.  The fragments were removed from the uterine cavity and were sent to  pathology.  Small bleeding vessels in the region of the resection were then  coagulated for excellent hemostasis.  The resectoscope was then  removed, and  a serrated and then a smooth curette was sequentially introduced into the  uterine cavity and the remaining quadrants of the uterus were curetted such  that the endometrium had a pretty texture to it.  The specimen was then sent  to pathology separately.  This concluded the patient's procedure.  Hemostasis was noticed to be excellent.  All of the instruments were removed  from the vagina as was the Foley catheter.   The patient was cleansed with Betadine, taken out of the dorsal lithotomy  position, and escorted to the recovery room in stable and awake condition.  There were no complications to the procedure.  All sponge, needle and  instrument counts were correct.                                               Randye Lobo, M.D.    BES/MEDQ  D:  03/07/2002  T:  03/07/2002  Job:  [id number]

## 2010-10-02 ENCOUNTER — Encounter (INDEPENDENT_AMBULATORY_CARE_PROVIDER_SITE_OTHER): Payer: Self-pay

## 2010-11-12 LAB — CBC
Hemoglobin: 12.4
MCHC: 32.2
RBC: 5.07
RDW: 16.9 — ABNORMAL HIGH

## 2010-11-12 LAB — POCT URINALYSIS DIP (DEVICE)
Protein, ur: 30 — AB
Specific Gravity, Urine: 1.025
pH: 6

## 2010-11-12 LAB — DIFFERENTIAL
Basophils Relative: 1
Eosinophils Absolute: 0.3
Eosinophils Relative: 3
Lymphs Abs: 3.2
Monocytes Absolute: 0.6
Monocytes Relative: 6

## 2010-11-12 LAB — COMPREHENSIVE METABOLIC PANEL
ALT: 13
AST: 13
Alkaline Phosphatase: 64
Calcium: 8.9
GFR calc Af Amer: >60
Potassium: 3.1 — ABNORMAL LOW
Sodium: 140
Total Protein: 7

## 2010-11-12 LAB — POCT PREGNANCY, URINE
Operator id: 247071
Preg Test, Ur: NEGATIVE

## 2012-11-07 ENCOUNTER — Emergency Department (HOSPITAL_COMMUNITY)
Admission: EM | Admit: 2012-11-07 | Discharge: 2012-11-07 | Disposition: A | Discharge status: Home / Self Care | Payer: BC Managed Care – PPO | Attending: Emergency Medicine | Admitting: Emergency Medicine

## 2012-11-07 ENCOUNTER — Emergency Department (HOSPITAL_COMMUNITY): Payer: BC Managed Care – PPO

## 2012-11-07 ENCOUNTER — Encounter (HOSPITAL_COMMUNITY): Payer: Self-pay | Admitting: Emergency Medicine

## 2012-11-07 DIAGNOSIS — S8990XA Unspecified injury of unspecified lower leg, initial encounter: Secondary | ICD-10-CM | POA: Insufficient documentation

## 2012-11-07 DIAGNOSIS — X500XXA Overexertion from strenuous movement or load, initial encounter: Secondary | ICD-10-CM | POA: Insufficient documentation

## 2012-11-07 DIAGNOSIS — M25571 Pain in right ankle and joints of right foot: Secondary | ICD-10-CM

## 2012-11-07 DIAGNOSIS — F17200 Nicotine dependence, unspecified, uncomplicated: Secondary | ICD-10-CM | POA: Insufficient documentation

## 2012-11-07 DIAGNOSIS — Y92009 Unspecified place in unspecified non-institutional (private) residence as the place of occurrence of the external cause: Secondary | ICD-10-CM | POA: Insufficient documentation

## 2012-11-07 DIAGNOSIS — Y9389 Activity, other specified: Secondary | ICD-10-CM | POA: Insufficient documentation

## 2012-11-07 MED ORDER — HYDROCODONE-ACETAMINOPHEN 5-325 MG PO TABS: 1.0000 | ORAL_TABLET | Freq: Four times a day (QID) | ORAL | Status: DC | PRN

## 2012-11-07 NOTE — ED Notes (Signed)
Pt presents to department for evaluation of L ankle pain. States she tripped over tree branch in yard, now states increased pain and swelling to L ankle. Able to wiggle digits. Capillary refill less than 2 seconds. Pt is alert and oriented x4. NAD.

## 2012-11-07 NOTE — ED Provider Notes (Signed)
CSN: [id number]     Arrival date & time 11/07/12  1722 History  This chart was scribed for Anna Morn, NP working with Enid Skeens, MD by Carl Best, ED Scribe. This patient was seen in room TR05C/TR05C and the patient's care was started at 6:39 PM.     Chief Complaint  Patient presents with  . Ankle Pain    Patient is a 42 y.o. female presenting with ankle pain. The history is provided by the patient. No language interpreter was used.  Ankle Pain  HPI Comments: Anna Huffman is a 42 y.o. female who presents to the Emergency Department complaining of constant left ankle pain that radiates to her left foot and up the left shin that started at 10:30 AM this morning.  The patient states that she tripped over a tree branch in the yard and noticed increased pain and swelling to the left ankle.  She states that she is unable to walk on the left ankle.  The patient states that she can wiggle her toes on her left foot without any complications.   No past medical history on file. History reviewed. No pertinent past surgical history. No family history on file. History  Substance Use Topics  . Smoking status: Current Every Day Smoker    Types: Cigarettes  . Smokeless tobacco: Not on file  . Alcohol Use: No   OB History   Grav Para Term Preterm Abortions TAB SAB Ect Mult Living                 Review of Systems  Musculoskeletal: Positive for arthralgias (left ankle pain).  All other systems reviewed and are negative.    Allergies  Codeine  Home Medications  No current outpatient prescriptions on file.  Triage Vitals: BP 125/86  Pulse 88  Temp(Src) 98.8 F (37.1 C) (Oral)  Resp 16  SpO2 98%  Physical Exam  Nursing note and vitals reviewed. Constitutional: She is oriented to person, place, and time. She appears well-developed and well-nourished. No distress.  HENT:  Head: Normocephalic and atraumatic.  Eyes: EOM are normal.  Neck: Neck supple. No tracheal deviation  present.  Cardiovascular: Normal rate.   Pulmonary/Chest: Effort normal. No respiratory distress.  Musculoskeletal: Normal range of motion.  Tenderness in the lateral aspect of the malleolus with swelling  Neurological: She is alert and oriented to person, place, and time.  Skin: Skin is warm and dry.  discoloration along the outer aspect of the left foot  Psychiatric: She has a normal mood and affect. Her behavior is normal.    ED Course  Procedures (including critical care time)  DIAGNOSTIC STUDIES: Oxygen Saturation is 98% on room air, normal by my interpretation.    COORDINATION OF CARE: 6:41 PM- Discussed the x-ray results that did not reveal any clear signs of a fracture.  Discussed treating the ankle injury like a fracture and advised the patient to follow up with the orthopedic doctor.  Discussed discharging the patient with crutches, pain medication, and a splint.  Advised the patient to elevate the left ankle and ice the area.  The patient agreed to the treatment plan.     Labs Review Labs Reviewed - No data to display Imaging Review Dg Ankle Complete Left  11/07/2012   CLINICAL DATA:  Twisted left ankle and complains of pain.  EXAM: LEFT ANKLE COMPLETE - 3+ VIEW  COMPARISON:  None.  FINDINGS: Three views of the left ankle were obtained. There is no  definite fracture or dislocation. There is subtle lucency involving the distal fibula on the oblique view but it is unclear if this represents a fracture. Otherwise, normal alignment of the ankle.  IMPRESSION: No definite fracture in the left ankle. Subtle lucency involving the distal fibula one view as described. Recommend clinical correlation in this area. If there is high concern for a fracture, recommend stabilization and follow up imaging.   Electronically Signed   By: Richarda Overlie M.D.   On: 11/07/2012 18:33    EKG Interpretation   None      Radiology results reviewed and shared with patient.  Question of distal fibular  fracture.  Will splint as fracture and have patient follow-up with orthopedics. MDM  Left ankle pain.  I personally performed the services described in this documentation, which was scribed in my presence. The recorded information has been reviewed and is accurate.    Jimmye Norman, NP 11/08/12 [phone]

## 2012-11-07 NOTE — Progress Notes (Signed)
Orthopedic Tech Progress Note Patient Details:  Anna Huffman [birth year] [id number]  Ortho Devices Type of Ortho Device: Ace wrap;Crutches;Stirrup splint Ortho Device/Splint Location: LLE Ortho Device/Splint Interventions: Ordered;Application   Jennye Moccasin 11/07/2012, 7:21 PM

## 2012-11-07 NOTE — ED Notes (Signed)
Pt discharged.Vital signs stable and GCS 15.Discharge instructions given. 

## 2012-11-08 NOTE — ED Provider Notes (Signed)
Medical screening examination/treatment/procedure(s) were performed by non-physician practitioner and as supervising physician I was immediately available for consultation/collaboration.   Enid Skeens, MD 11/08/12 [phone]

## 2013-03-04 ENCOUNTER — Encounter (INDEPENDENT_AMBULATORY_CARE_PROVIDER_SITE_OTHER): Payer: Self-pay

## 2013-03-13 ENCOUNTER — Other Ambulatory Visit (INDEPENDENT_AMBULATORY_CARE_PROVIDER_SITE_OTHER): Payer: Self-pay | Admitting: Physician Assistant

## 2013-07-23 DIAGNOSIS — D259 Leiomyoma of uterus, unspecified: Secondary | ICD-10-CM | POA: Insufficient documentation

## 2013-07-23 DIAGNOSIS — R7303 Prediabetes: Secondary | ICD-10-CM | POA: Insufficient documentation

## 2014-01-28 LAB — COMPREHENSIVE METABOLIC PANEL, BLOOD
A/G Ratio: 1.8 (ref 1.1–2.5)
ALT (SGPT): 18 IU/L (ref 0–32)
AST: 18 IU/L (ref 0–40)
Albumin: 4.2 g/dL (ref 3.5–5.5)
Alkaline Phos: 32 IU/L — ABNORMAL LOW (ref 39–117)
BUN/Creatinine Ratio: 16 (ref 9–23)
BUN: 12 mg/dL (ref 6–24)
Bilirubin, Total: 0.5 mg/dL (ref 0.0–1.2)
Calcium: 9.1 mg/dL (ref 8.7–10.2)
Carbon Dioxide: 24 mmol/L (ref 18–29)
Chloride: 102 mmol/L (ref 97–108)
Creatinine: 0.74 mg/dL (ref 0.57–1.00)
Globulin, Total: 2.4 g/dL (ref 1.5–4.5)
Glucose: 84 mg/dL (ref 65–99)
Potassium: 4.1 mmol/L (ref 3.5–5.2)
Protein, Total, Serum: 6.6 g/dL (ref 6.0–8.5)
Sodium: 139 mmol/L (ref 134–144)
eGFR If Africn Am: 116 mL/min/{1.73_m2} (ref >59)
eGFR If NonAfricn Am: 101 mL/min/{1.73_m2} (ref >59)

## 2014-01-28 LAB — UA/M W/REFLEX CULTURE, ROUTINE-LABORP OR QUEST
Bilirubin: NEGATIVE
Blood: NEGATIVE
Glucose: NEGATIVE
Ketones: NEGATIVE
Leuk Esterase: NEGATIVE
Nitrite: NEGATIVE
Protein: NEGATIVE
Specific Gravity: 1.015 (ref 1.005–1.030)
Urobilinogen,Semi-Qn: 0.2 mg/dL (ref 0.0–1.9)
pH: 6 (ref 5.0–7.5)

## 2014-01-28 LAB — MICROSCOPIC EXAMINATION-LABCORP: Bacteria: NONE SEEN

## 2014-01-28 LAB — TSH AND FREE T4
T4,Free(Direct): 1.11 ng/dL (ref 0.82–1.77)
TSH: 2.22 u[IU]/mL (ref 0.450–4.500)

## 2014-01-28 LAB — LIPID(CHOL FRACT) PANEL, BLOOD
Cholesterol: 164 mg/dL (ref 100–199)
HDL Cholesterol: 48 mg/dL (ref >39)
LDL Cholesterol Calc: 97 mg/dL (ref 0–99)
Triglycerides: 97 mg/dL (ref 0–149)
VLDL Cholesterol Cal: 19 mg/dL (ref 5–40)

## 2014-01-28 LAB — CBC WITH DIFF, BLOOD
Baso (Absolute): 0 10*3/uL (ref 0.0–0.2)
Basos: 1 %
Eos (Absolute): 0.2 10*3/uL (ref 0.0–0.4)
Eos: 3 %
Hematocrit: 37.7 % (ref 34.0–46.6)
Hemoglobin: 12.6 g/dL (ref 11.1–15.9)
Immature Grans (Abs): 0 10*3/uL (ref 0.0–0.1)
Immature Granulocytes: 0 %
Lymphs (Absolute): 1.8 10*3/uL (ref 0.7–3.1)
Lymphs: 35 %
MCH: 29.9 pg (ref 26.6–33.0)
MCHC: 33.4 g/dL (ref 31.5–35.7)
MCV: 89 fL (ref 79–97)
Monocytes(Absolute): 0.4 10*3/uL (ref 0.1–0.9)
Monocytes: 9 %
Neutrophils (Absolute): 2.7 10*3/uL (ref 1.4–7.0)
Neutrophils: 52 %
Platelets: 257 10*3/uL (ref 150–379)
RBC: 4.22 x10E6/uL (ref 3.77–5.28)
RDW: 13.8 % (ref 12.3–15.4)
WBC: 5.1 10*3/uL (ref 3.4–10.8)

## 2014-07-05 ENCOUNTER — Other Ambulatory Visit: Payer: Self-pay | Admitting: Obstetrics & Gynecology

## 2014-07-10 ENCOUNTER — Other Ambulatory Visit: Payer: Self-pay | Admitting: Obstetrics & Gynecology

## 2014-07-10 DIAGNOSIS — R928 Other abnormal and inconclusive findings on diagnostic imaging of breast: Secondary | ICD-10-CM

## 2014-07-10 LAB — CYTOLOGY - PAP

## 2014-07-25 ENCOUNTER — Ambulatory Visit
Admission: RE | Admit: 2014-07-25 | Discharge: 2014-07-25 | Disposition: A | Discharge status: Home / Self Care | Payer: Commercial Managed Care - PPO | Source: Ambulatory Visit | Attending: Obstetrics & Gynecology | Admitting: Obstetrics & Gynecology

## 2014-07-25 DIAGNOSIS — R928 Other abnormal and inconclusive findings on diagnostic imaging of breast: Secondary | ICD-10-CM

## 2014-09-16 ENCOUNTER — Emergency Department (HOSPITAL_COMMUNITY)
Admission: EM | Admit: 2014-09-16 | Discharge: 2014-09-16 | Disposition: A | Discharge status: Home / Self Care | Payer: Commercial Managed Care - PPO | Attending: Physician Assistant | Admitting: Physician Assistant

## 2014-09-16 ENCOUNTER — Emergency Department (HOSPITAL_COMMUNITY): Payer: Commercial Managed Care - PPO

## 2014-09-16 ENCOUNTER — Encounter (HOSPITAL_COMMUNITY): Payer: Self-pay | Admitting: Family Medicine

## 2014-09-16 DIAGNOSIS — X58XXXA Exposure to other specified factors, initial encounter: Secondary | ICD-10-CM | POA: Insufficient documentation

## 2014-09-16 DIAGNOSIS — S93602A Unspecified sprain of left foot, initial encounter: Secondary | ICD-10-CM | POA: Insufficient documentation

## 2014-09-16 DIAGNOSIS — Y9289 Other specified places as the place of occurrence of the external cause: Secondary | ICD-10-CM | POA: Diagnosis not present

## 2014-09-16 DIAGNOSIS — Z720 Tobacco use: Secondary | ICD-10-CM | POA: Diagnosis not present

## 2014-09-16 DIAGNOSIS — Y998 Other external cause status: Secondary | ICD-10-CM | POA: Diagnosis not present

## 2014-09-16 DIAGNOSIS — Y9389 Activity, other specified: Secondary | ICD-10-CM | POA: Diagnosis not present

## 2014-09-16 DIAGNOSIS — S99922A Unspecified injury of left foot, initial encounter: Secondary | ICD-10-CM | POA: Diagnosis present

## 2014-09-16 MED ORDER — NAPROXEN 500 MG PO TABS: 500.0000 mg | ORAL_TABLET | Freq: Two times a day (BID) | ORAL | Status: DC

## 2014-09-16 MED ORDER — HYDROCODONE-ACETAMINOPHEN 5-325 MG PO TABS
2.0000 | ORAL_TABLET | Freq: Once | ORAL | Status: AC
Start: 2014-09-16 — End: 2014-09-16
  Administered 2014-09-16: 2 via ORAL
  Filled 2014-09-16: qty 2

## 2014-09-16 MED ORDER — HYDROCODONE-ACETAMINOPHEN 5-325 MG PO TABS: 1.0000 | ORAL_TABLET | Freq: Four times a day (QID) | ORAL | Status: DC | PRN

## 2014-09-16 NOTE — Discharge Instructions (Signed)
Foot Sprain The muscles and cord like structures which attach muscle to bone (tendons) that surround the feet are made up of units. A foot sprain can occur at the weakest spot in any of these units. This condition is most often caused by injury to or overuse of the foot, as from playing contact sports, or aggravating a previous injury, or from poor conditioning, or obesity. SYMPTOMS  Pain with movement of the foot.  Tenderness and swelling at the injury site.  Loss of strength is present in moderate or severe sprains. THE THREE GRADES OR SEVERITY OF FOOT SPRAIN ARE:  Mild (Grade I): Slightly pulled muscle without tearing of muscle or tendon fibers or loss of strength.  Moderate (Grade II): Tearing of fibers in a muscle, tendon, or at the attachment to bone, with small decrease in strength.  Severe (Grade III): Rupture of the muscle-tendon-bone attachment, with separation of fibers. Severe sprain requires surgical repair. Often repeating (chronic) sprains are caused by overuse. Sudden (acute) sprains are caused by direct injury or over-use. DIAGNOSIS  Diagnosis of this condition is usually by your own observation. If problems continue, a caregiver may be required for further evaluation and treatment. X-rays may be required to make sure there are not breaks in the bones (fractures) present. Continued problems may require physical therapy for treatment. PREVENTION  Use strength and conditioning exercises appropriate for your sport.  Warm up properly prior to working out.  Use athletic shoes that are made for the sport you are participating in.  Allow adequate time for healing. Early return to activities makes repeat injury more likely, and can lead to an unstable arthritic foot that can result in prolonged disability. Mild sprains generally heal in 3 to 10 days, with moderate and severe sprains taking 2 to 10 weeks. Your caregiver can help you determine the proper time required for  healing. HOME CARE INSTRUCTIONS   Apply ice to the injury for 15-20 minutes, 03-04 times per day. Put the ice in a plastic bag and place a towel between the bag of ice and your skin.  An elastic wrap (like an Ace bandage) may be used to keep swelling down.  Keep foot above the level of the heart, or at least raised on a footstool, when swelling and pain are present.  Try to avoid use other than gentle range of motion while the foot is painful. Do not resume use until instructed by your caregiver. Then begin use gradually, not increasing use to the point of pain. If pain does develop, decrease use and continue the above measures, gradually increasing activities that do not cause discomfort, until you gradually achieve normal use.  Use crutches if and as instructed, and for the length of time instructed.  Keep injured foot and ankle wrapped between treatments.  Massage foot and ankle for comfort and to keep swelling down. Massage from the toes up towards the knee.  Only take over-the-counter or prescription medicines for pain, discomfort, or fever as directed by your caregiver. SEEK IMMEDIATE MEDICAL CARE IF:   Your pain and swelling increase, or pain is not controlled with medications.  You have loss of feeling in your foot or your foot turns cold or blue.  You develop new, unexplained symptoms, or an increase of the symptoms that brought you to your caregiver. MAKE SURE YOU:   Understand these instructions.  Will watch your condition.  Will get help right away if you are not doing well or get worse. Document Released:   07/03/2001 Document Revised: 04/05/2011 Document Reviewed: 08/31/2007 Exit[employer] Patient Information 2015 [employer]. This information is not intended to replace advice given to you by your health care provider. Make sure you discuss any questions you have with your health care provider.  

## 2014-09-16 NOTE — ED Notes (Signed)
Questions concerns r/t dc were denied. Pt is ambulatory and a&ox4

## 2014-09-16 NOTE — ED Notes (Signed)
Pt states she stepped off the curb and her left foot turned outward. Pt states she heard a "crack".

## 2014-09-16 NOTE — ED Provider Notes (Signed)
CSN: [id number]     Arrival date & time 09/16/14  2057 History  This chart was scribed for Antonietta Breach, PA-C, working with Macarthur Critchley, MD by Steva Colder, ED Scribe. The patient was seen in room WTR8/WTR8 at 9:14 PM.     Chief Complaint  Patient presents with  . Foot Injury    The history is provided by the patient. No language interpreter was used.    Anna Huffman is a 44 y.o. female who presents to the Emergency Department complaining of throbbing stabbing left foot pain onset 6 PM. Pt states that she stepped off a curb and her left foot went to the side. Pt notes that she hurt the same foot 2 years ago. Pt reports that her pain is radiating from her left fourth toe up her left leg. Pt is having associated symptoms of joint swelling, gait problem due to pain, and tingling to left toes. She notes that she has tried ice, soak with epsom salt with no medications for the relief of her symptoms. She denies color change, wound, and any other symptoms. Pt is allergic to codeine and it makes her itch.   History reviewed. No pertinent past medical history. Past Surgical History  Procedure Laterality Date  . Breast reduction surgery     History reviewed. No pertinent family history. Social History  Substance Use Topics  . Smoking status: Current Every Day Smoker -- 0.50 packs/day    Types: Cigarettes  . Smokeless tobacco: None  . Alcohol Use: No   OB History    No data available     Review of Systems  Musculoskeletal: Positive for joint swelling, arthralgias and gait problem.  Skin: Negative for color change, rash and wound.  Neurological:       Tingling to left toes      Allergies  Codeine  Home Medications   Prior to Admission medications   Medication Sig Start Date End Date Taking? Authorizing Provider  HYDROcodone-acetaminophen (NORCO/VICODIN) 5-325 MG per tablet Take 1 tablet by mouth every 6 (six) hours as needed. 09/16/14   Antonietta Breach, PA-C  naproxen  (NAPROSYN) 500 MG tablet Take 1 tablet (500 mg total) by mouth 2 (two) times daily. 09/16/14   Antonietta Breach, PA-C   BP 133/82 mmHg  Pulse 90  Temp(Src) 98.5 F (36.9 C) (Oral)  Resp 20  Ht 5' (1.524 m)  Wt 170 lb (77.111 kg)  BMI 33.20 kg/m2  SpO2 100%   Physical Exam  Constitutional: She is oriented to person, place, and time. She appears well-developed and well-nourished. No distress.  HENT:  Head: Normocephalic and atraumatic.  Eyes: Conjunctivae and EOM are normal. No scleral icterus.  Neck: Normal range of motion.  Cardiovascular: Normal rate, regular rhythm and intact distal pulses.   DP and PT pulses 2+ in the left lower extremity  Pulmonary/Chest: Effort normal. No respiratory distress.  Musculoskeletal: Normal range of motion.       Left ankle: Normal.       Left foot: There is tenderness, bony tenderness and swelling. There is normal range of motion, normal capillary refill, no crepitus and no deformity.       Feet:  Neurological: She is alert and oriented to person, place, and time. She exhibits normal muscle tone. Coordination normal.  Sensation to light touch intact in the left lower extremity and foot. Patient able to wiggle all toes.  Skin: Skin is warm and dry. No rash noted. She is not diaphoretic.  No erythema. No pallor.  Psychiatric: She has a normal mood and affect. Her behavior is normal.  Nursing note and vitals reviewed.   ED Course  Procedures (including critical care time) DIAGNOSTIC STUDIES: Oxygen Saturation is 100% on room air, normal by my interpretation.    COORDINATION OF CARE: 9:18 PM Discussed treatment plan with pt at bedside and pt agreed to plan.   Labs Review Labs Reviewed - No data to display  Imaging Review Dg Foot Complete Left  09/16/2014   CLINICAL DATA:  Throbbing pain in left foot.  EXAM: LEFT FOOT - COMPLETE 3+ VIEW  COMPARISON:  11/07/2012  FINDINGS: There is no evidence of fracture or dislocation. There is no evidence of  arthropathy or other focal bone abnormality. Soft tissues are unremarkable.  IMPRESSION: Negative.   Electronically Signed   By: Kerby Moors M.D.   On: 09/16/2014 21:49   I have personally reviewed and evaluated these images and lab results as part of my medical decision-making.   EKG Interpretation None      MDM   Final diagnoses:  Foot sprain, left, initial encounter    44 year old female with symptoms and exam findings consistent with strain of left foot. Patient neurovascularly intact. Postop shoe and crutches given for comfort. WBAT advised. Will manage with icing, NSAIDs, and Norco as needed. Primary care follow-up advised and return precautions given. Patient agreeable to plan with no unaddressed concerns. Patient discharged in good condition.  I personally performed the services described in this documentation, which was scribed in my presence. The recorded information has been reviewed and is accurate.   Filed Vitals:   09/16/14 2137  BP: 133/82  Pulse: 90  Temp: 98.5 F (36.9 C)  TempSrc: Oral  Resp: 20  Height: 5' (1.524 m)  Weight: 170 lb (77.111 kg)  SpO2: 100%      Antonietta Breach, PA-C 09/16/14 2219  Courteney Julio Alm, MD 09/16/14 2326

## 2015-03-17 ENCOUNTER — Emergency Department (HOSPITAL_COMMUNITY)
Admission: EM | Admit: 2015-03-17 | Discharge: 2015-03-17 | Disposition: A | Discharge status: Home / Self Care | Payer: Commercial Managed Care - PPO | Attending: Emergency Medicine | Admitting: Emergency Medicine

## 2015-03-17 ENCOUNTER — Encounter (HOSPITAL_COMMUNITY): Payer: Self-pay | Admitting: *Deleted

## 2015-03-17 DIAGNOSIS — R42 Dizziness and giddiness: Secondary | ICD-10-CM | POA: Insufficient documentation

## 2015-03-17 DIAGNOSIS — R0982 Postnasal drip: Secondary | ICD-10-CM | POA: Insufficient documentation

## 2015-03-17 DIAGNOSIS — R509 Fever, unspecified: Secondary | ICD-10-CM | POA: Insufficient documentation

## 2015-03-17 DIAGNOSIS — R0981 Nasal congestion: Secondary | ICD-10-CM | POA: Insufficient documentation

## 2015-03-17 DIAGNOSIS — F17210 Nicotine dependence, cigarettes, uncomplicated: Secondary | ICD-10-CM | POA: Insufficient documentation

## 2015-03-17 DIAGNOSIS — H73893 Other specified disorders of tympanic membrane, bilateral: Secondary | ICD-10-CM | POA: Insufficient documentation

## 2015-03-17 DIAGNOSIS — E669 Obesity, unspecified: Secondary | ICD-10-CM | POA: Insufficient documentation

## 2015-03-17 DIAGNOSIS — R1110 Vomiting, unspecified: Secondary | ICD-10-CM | POA: Insufficient documentation

## 2015-03-17 DIAGNOSIS — R6889 Other general symptoms and signs: Secondary | ICD-10-CM

## 2015-03-17 DIAGNOSIS — Z791 Long term (current) use of non-steroidal anti-inflammatories (NSAID): Secondary | ICD-10-CM | POA: Insufficient documentation

## 2015-03-17 DIAGNOSIS — R05 Cough: Secondary | ICD-10-CM | POA: Insufficient documentation

## 2015-03-17 DIAGNOSIS — R000 Tachycardia, unspecified: Secondary | ICD-10-CM | POA: Insufficient documentation

## 2015-03-17 DIAGNOSIS — R51 Headache: Secondary | ICD-10-CM | POA: Insufficient documentation

## 2015-03-17 MED ORDER — OSELTAMIVIR PHOSPHATE 75 MG PO CAPS: 75.0000 mg | ORAL_CAPSULE | Freq: Two times a day (BID) | ORAL | Status: DC

## 2015-03-17 MED ORDER — GUAIFENESIN 100 MG/5ML PO LIQD: 100.0000 mg | ORAL | Status: DC | PRN

## 2015-03-17 MED ORDER — ONDANSETRON 8 MG PO TBDP
8.0000 mg | ORAL_TABLET | Freq: Once | ORAL | Status: AC
  Administered 2015-03-17: 8 mg via ORAL
  Filled 2015-03-17: qty 1

## 2015-03-17 MED ORDER — IBUPROFEN 800 MG PO TABS
800.0000 mg | ORAL_TABLET | Freq: Once | ORAL | Status: AC
  Administered 2015-03-17: 800 mg via ORAL
  Filled 2015-03-17: qty 1

## 2015-03-17 MED ORDER — PROMETHAZINE-DM 6.25-15 MG/5ML PO SYRP: 5.0000 mL | ORAL_SOLUTION | Freq: Four times a day (QID) | ORAL | Status: DC | PRN

## 2015-03-17 NOTE — Discharge Instructions (Signed)
Viral Infections °A viral infection can be caused by different types of viruses. Most viral infections are not serious and resolve on their own. However, some infections may cause severe symptoms and may lead to further complications. °SYMPTOMS °Viruses can frequently cause: °· Minor sore throat. °· Aches and pains. °· Headaches. °· Runny nose. °· Different types of rashes. °· Watery eyes. °· Tiredness. °· Cough. °· Loss of appetite. °· Gastrointestinal infections, resulting in nausea, vomiting, and diarrhea. °These symptoms do not respond to antibiotics because the infection is not caused by bacteria. However, you might catch a bacterial infection following the viral infection. This is sometimes called a "superinfection." Symptoms of such a bacterial infection may include: °· Worsening sore throat with pus and difficulty swallowing. °· Swollen neck glands. °· Chills and a high or persistent fever. °· Severe headache. °· Tenderness over the sinuses. °· Persistent overall ill feeling (malaise), muscle aches, and tiredness (fatigue). °· Persistent cough. °· Yellow, green, or brown mucus production with coughing. °HOME CARE INSTRUCTIONS  °· Only take over-the-counter or prescription medicines for pain, discomfort, diarrhea, or fever as directed by your caregiver. °· Drink enough water and fluids to keep your urine clear or pale yellow. Sports drinks can provide valuable electrolytes, sugars, and hydration. °· Get plenty of rest and maintain proper nutrition. Soups and broths with crackers or rice are fine. °SEEK IMMEDIATE MEDICAL CARE IF:  °· You have severe headaches, shortness of breath, chest pain, neck pain, or an unusual rash. °· You have uncontrolled vomiting, diarrhea, or you are unable to keep down fluids. °· You or your child has an oral temperature above 102° F (38.9° C), not controlled by medicine. °· Your baby is older than 3 months with a rectal temperature of 102° F (38.9° C) or higher. °· Your baby is 3  months old or younger with a rectal temperature of 100.4° F (38° C) or higher. °MAKE SURE YOU:  °· Understand these instructions. °· Will watch your condition. °· Will get help right away if you are not doing well or get worse. °  °This information is not intended to replace advice given to you by your health care provider. Make sure you discuss any questions you have with your health care provider. °  °Document Released: 10/21/2004 Document Revised: 04/05/2011 Document Reviewed: 06/19/2014 °Elsevier Interactive Patient Education ©2016 [employer] ° °

## 2015-03-17 NOTE — ED Notes (Signed)
Pt here with flu like symptoms of URI and n/v and headache.  Pt reports that she has been light headed when she gets up.

## 2015-03-17 NOTE — ED Provider Notes (Signed)
CSN: [id number]     Arrival date & time 03/17/15  1832 History   First MD Initiated Contact with Patient 03/17/15 2123     Chief Complaint  Patient presents with  . Illness     (Consider location/radiation/quality/duration/timing/severity/associated sxs/prior Treatment) HPI   45 year old female presents with flulike symptoms. Patient report for the past 2 days she has had a fever as high as 102, sinus headache, ear pain, congestion, postnasal drips, throat irritation, myalgias, mild nonproductive cough, and also having several bouts of vomiting today. Symptom is persistent, she feels weak and tired and lightheadedness. She has tried over-the-counter medication with minimal relief. She admits to recent sick contact after going to the gym recently. She is a smoker but has not been smoking during these episodes. Patient denies productive cough, hemoptysis, abdominal pain, back pain, dysuria or rash. No history of COPD or asthma. Has not had a flu shot on the pneumonia shot this year.  History reviewed. No pertinent past medical history. Past Surgical History  Procedure Laterality Date  . Breast reduction surgery     No family history on file. Social History  Substance Use Topics  . Smoking status: Current Every Day Smoker -- 0.50 packs/day    Types: Cigarettes  . Smokeless tobacco: None  . Alcohol Use: No   OB History    No data available     Review of Systems  All other systems reviewed and are negative.     Allergies  Codeine  Home Medications   Prior to Admission medications   Medication Sig Start Date End Date Taking? Authorizing Provider  HYDROcodone-acetaminophen (NORCO/VICODIN) 5-325 MG per tablet Take 1 tablet by mouth every 6 (six) hours as needed. 09/16/14   Antonietta Breach, PA-C  naproxen (NAPROSYN) 500 MG tablet Take 1 tablet (500 mg total) by mouth 2 (two) times daily. 09/16/14   Antonietta Breach, PA-C   BP 152/96 mmHg  Pulse 104  Temp(Src) 99 F (37.2 C) (Oral)   Resp 20  Wt 77.565 kg  SpO2 100% Physical Exam  Constitutional: She is oriented to person, place, and time. She appears well-developed and well-nourished. No distress.  Obese African-American female, nontoxic in appearance.  HENT:  Head: Atraumatic.  Ears: TMs mildly erythematous bilaterally without effusion Nose: Boggy turbinate with rhinorrhea Throat: Uvula is midline no tonsillar enlargement or exudates, no trismus  Eyes: Conjunctivae are normal.  Neck: Normal range of motion. Neck supple.  No nuchal rigidity  Cardiovascular:  Mild tachycardia without murmurs rubs or gallops  Pulmonary/Chest: Effort normal and breath sounds normal. No respiratory distress. She has no wheezes. She has no rales. She exhibits no tenderness.  Abdominal: Soft. She exhibits no distension. There is no tenderness.  Musculoskeletal: She exhibits no edema.  Lymphadenopathy:    She has cervical adenopathy.  Neurological: She is alert and oriented to person, place, and time.  Skin: No rash noted.  Psychiatric: She has a normal mood and affect.  Nursing note and vitals reviewed.   ED Course  Procedures (including critical care time)   MDM   Final diagnoses:  Flu-like symptoms    BP 152/96 mmHg  Pulse 104  Temp(Src) 99 F (37.2 C) (Oral)  Resp 20  Wt 77.565 kg  SpO2 100%   9:35 PM Patient here with flulike symptoms. She has no nuchal rigidity to suggest meningitis. Her lungs exams are unremarkable and no evidence of hypoxia or fever to suggest pneumonia. Her abdomen is nontender. She is mildly tachycardic but  I have low suspicion for PE. Patient is able to tolerates by mouth. I will provide symptomatic treatment for her symptoms and recommend return if symptoms worsen. Patient was understanding and agrees with plan.  Domenic Moras, PA-C 03/17/15 2140  Virgel Manifold, MD 03/20/15 [phone]

## 2015-08-27 ENCOUNTER — Ambulatory Visit (HOSPITAL_COMMUNITY)
Admission: EM | Admit: 2015-08-27 | Discharge: 2015-08-27 | Disposition: A | Discharge status: Home / Self Care | Payer: PRIVATE HEALTH INSURANCE | Attending: Family Medicine | Admitting: Family Medicine

## 2015-08-27 ENCOUNTER — Encounter (HOSPITAL_COMMUNITY): Payer: Self-pay | Admitting: Nurse Practitioner

## 2015-08-27 DIAGNOSIS — R5383 Other fatigue: Secondary | ICD-10-CM | POA: Insufficient documentation

## 2015-08-27 DIAGNOSIS — J029 Acute pharyngitis, unspecified: Secondary | ICD-10-CM | POA: Diagnosis not present

## 2015-08-27 DIAGNOSIS — F17210 Nicotine dependence, cigarettes, uncomplicated: Secondary | ICD-10-CM | POA: Insufficient documentation

## 2015-08-27 DIAGNOSIS — R110 Nausea: Secondary | ICD-10-CM | POA: Diagnosis not present

## 2015-08-27 LAB — POCT RAPID STREP A: STREPTOCOCCUS, GROUP A SCREEN (DIRECT): NEGATIVE

## 2015-08-27 MED ORDER — ONDANSETRON 4 MG PO TBDP: 4.0000 mg | ORAL_TABLET | Freq: Three times a day (TID) | ORAL | 0 refills | Status: AC | PRN

## 2015-08-27 NOTE — ED Triage Notes (Signed)
Pt c/o 6 day history malaise, nausea, headaches. She has developed white spots in her throat and painful swallowing since yesterday. She is concerned for strep, her son was just dx with strep here earlier this week. She is alert and breathing easily.

## 2015-08-27 NOTE — ED Provider Notes (Signed)
CSN: [id number]     Arrival date & time 08/27/15  1351 History   First MD Initiated Contact with Patient 08/27/15 1512     Chief Complaint  Patient presents with  . Sore Throat  . Headache  . Fatigue   (Consider location/radiation/quality/duration/timing/severity/associated sxs/prior Treatment) The history is provided by the patient.  Sore Throat  This is a new problem. Episode onset: 4 days ago. The problem occurs constantly. The problem has not changed since onset.Pertinent negatives include no chest pain, no abdominal pain and no shortness of breath. Associated symptoms comments: Positive for nasal congestion and headache (described as dull), fatigue, overall not feeling well x 1 week.  Negative for fever, cough, shortness of breath, chest pain or abdominal pain. Her son has a confirmed strep 2 days ago. The symptoms are aggravated by swallowing. Nothing relieves the symptoms.    History reviewed. No pertinent past medical history. Past Surgical History:  Procedure Laterality Date  . BREAST REDUCTION SURGERY     History reviewed. No pertinent family history. Social History  Substance Use Topics  . Smoking status: Current Every Day Smoker    Packs/day: 0.50    Types: Cigarettes  . Smokeless tobacco: Never Used  . Alcohol use Yes   OB History    No data available     Review of Systems  Constitutional: Positive for fatigue. Negative for chills and fever.  HENT: Positive for congestion and sore throat. Negative for ear pain and rhinorrhea.        Describes her throat as being more irritated rather than sore, have noticed some white substances on her tonsils  Respiratory: Negative for cough, shortness of breath and wheezing.   Cardiovascular: Negative for chest pain and palpitations.  Gastrointestinal: Positive for nausea. Negative for abdominal pain and vomiting.    Allergies  Codeine  Home Medications   Prior to Admission medications   Medication Sig Start Date End Date  Taking? Authorizing Provider  guaiFENesin (ROBITUSSIN) 100 MG/5ML liquid Take 5-10 mLs (100-200 mg total) by mouth every 4 (four) hours as needed for congestion. 03/17/15   Domenic Moras, PA-C  HYDROcodone-acetaminophen (NORCO/VICODIN) 5-325 MG per tablet Take 1 tablet by mouth every 6 (six) hours as needed. 09/16/14   Antonietta Breach, PA-C  naproxen (NAPROSYN) 500 MG tablet Take 1 tablet (500 mg total) by mouth 2 (two) times daily. 09/16/14   Antonietta Breach, PA-C  ondansetron (ZOFRAN ODT) 4 MG disintegrating tablet Take 1 tablet (4 mg total) by mouth every 8 (eight) hours as needed for nausea or vomiting. 08/27/15 09/01/15  Barry Dienes, NP  oseltamivir (TAMIFLU) 75 MG capsule Take 1 capsule (75 mg total) by mouth every 12 (twelve) hours. 03/17/15   Domenic Moras, PA-C  promethazine-dextromethorphan (PROMETHAZINE-DM) 6.25-15 MG/5ML syrup Take 5 mLs by mouth 4 (four) times daily as needed for cough. 03/17/15   Domenic Moras, PA-C   Meds Ordered and Administered this Visit  Medications - No data to display  BP 136/75 (BP Location: Left Arm)   Pulse 83   Temp 98.2 F (36.8 C) (Oral)   Resp 18   SpO2 100%  No data found.   Physical Exam  Constitutional: She appears well-developed and well-nourished.  HENT:  Head: Normocephalic and atraumatic.  Ears canals are clear; TM pearly gray without erythema. Tonsils and pharynx unremarkable without swelling, exudate, or redness.   Eyes: EOM are normal. Pupils are equal, round, and reactive to light.  Cardiovascular: Normal rate, regular rhythm and normal heart  sounds.   Pulmonary/Chest: Effort normal and breath sounds normal.  Abdominal: Soft. Bowel sounds are normal. There is no tenderness.    Urgent Care Course   Clinical Course    Procedures (including critical care time)  Labs Review Labs Reviewed  CULTURE, GROUP A STREP [facility])  POCT RAPID STREP A    Imaging Review No results found.   Visual Acuity Review  Right Eye Distance:   Left Eye Distance:    Bilateral Distance:    Right Eye Near:   Left Eye Near:    Bilateral Near:         MDM   1. Pharyngitis   2. Nausea    Prescriptions given to treat her nausea. Patient educated to use salt water gargle, honey, throat lozenges, or butterscotch candy to help with sore throat.  Patient states understanding and will call with questions or problems. Patient instructed to call or follow up with his/her primary care doctor if failure to improve or change in symptoms. Discharge instruction given.     Barry Dienes, NP 08/27/15 2330

## 2015-08-30 LAB — CULTURE, GROUP A STREP (THRC)

## 2016-03-23 ENCOUNTER — Telehealth: Payer: Self-pay | Admitting: Emergency Medicine

## 2016-03-23 NOTE — Telephone Encounter (Signed)
Pre-visit attempted no answer. Left message on voicemail for return call to office.

## 2016-03-26 ENCOUNTER — Ambulatory Visit (INDEPENDENT_AMBULATORY_CARE_PROVIDER_SITE_OTHER): Payer: PRIVATE HEALTH INSURANCE | Admitting: Family Medicine

## 2016-03-26 ENCOUNTER — Other Ambulatory Visit (HOSPITAL_COMMUNITY)
Admission: RE | Admit: 2016-03-26 | Discharge: 2016-03-26 | Disposition: A | Discharge status: Home / Self Care | Payer: PRIVATE HEALTH INSURANCE | Source: Ambulatory Visit | Attending: Family Medicine | Admitting: Family Medicine

## 2016-03-26 ENCOUNTER — Encounter: Payer: Self-pay | Admitting: Family Medicine

## 2016-03-26 VITALS — BP 122/86 | HR 86 | Temp 97.8°F | Ht 61.5 in | Wt 168.4 lb

## 2016-03-26 DIAGNOSIS — E6609 Other obesity due to excess calories: Secondary | ICD-10-CM

## 2016-03-26 DIAGNOSIS — F419 Anxiety disorder, unspecified: Secondary | ICD-10-CM | POA: Diagnosis not present

## 2016-03-26 DIAGNOSIS — Z6830 Body mass index (BMI) 30.0-30.9, adult: Secondary | ICD-10-CM | POA: Diagnosis not present

## 2016-03-26 DIAGNOSIS — Z113 Encounter for screening for infections with a predominantly sexual mode of transmission: Secondary | ICD-10-CM | POA: Diagnosis present

## 2016-03-26 DIAGNOSIS — Z0000 Encounter for general adult medical examination without abnormal findings: Secondary | ICD-10-CM

## 2016-03-26 DIAGNOSIS — H6981 Other specified disorders of Eustachian tube, right ear: Secondary | ICD-10-CM | POA: Diagnosis not present

## 2016-03-26 DIAGNOSIS — N760 Acute vaginitis: Secondary | ICD-10-CM | POA: Diagnosis not present

## 2016-03-26 DIAGNOSIS — F17200 Nicotine dependence, unspecified, uncomplicated: Secondary | ICD-10-CM | POA: Diagnosis not present

## 2016-03-26 DIAGNOSIS — H6991 Unspecified Eustachian tube disorder, right ear: Secondary | ICD-10-CM

## 2016-03-26 DIAGNOSIS — R7303 Prediabetes: Secondary | ICD-10-CM | POA: Diagnosis not present

## 2016-03-26 DIAGNOSIS — Z01419 Encounter for gynecological examination (general) (routine) without abnormal findings: Secondary | ICD-10-CM | POA: Diagnosis present

## 2016-03-26 DIAGNOSIS — B9689 Other specified bacterial agents as the cause of diseases classified elsewhere: Secondary | ICD-10-CM

## 2016-03-26 DIAGNOSIS — Z124 Encounter for screening for malignant neoplasm of cervix: Secondary | ICD-10-CM | POA: Diagnosis not present

## 2016-03-26 LAB — CBC WITH DIFFERENTIAL/PLATELET
Basophils Absolute: 0.1 10*3/uL (ref 0.0–0.1)
Basophils Relative: 0.9 % (ref 0.0–3.0)
Eosinophils Absolute: 0.2 10*3/uL (ref 0.0–0.7)
Eosinophils Relative: 1.1 % (ref 0.0–5.0)
HCT: 44.9 % (ref 36.0–46.0)
Hemoglobin: 14.5 g/dL (ref 12.0–15.0)
Lymphocytes Relative: 31.9 % (ref 12.0–46.0)
Lymphs Abs: 4.6 10*3/uL — ABNORMAL HIGH (ref 0.7–4.0)
MCHC: 32.3 g/dL (ref 30.0–36.0)
MCV: 78.1 fl (ref 78.0–100.0)
Monocytes Absolute: 0.6 10*3/uL (ref 0.1–1.0)
Monocytes Relative: 4 % (ref 3.0–12.0)
Neutro Abs: 9 10*3/uL — ABNORMAL HIGH (ref 1.4–7.7)
Neutrophils Relative %: 62.1 % (ref 43.0–77.0)
Platelets: 445 10*3/uL — ABNORMAL HIGH (ref 150.0–400.0)
RBC: 5.75 Mil/uL — ABNORMAL HIGH (ref 3.87–5.11)
RDW: 15.8 % — ABNORMAL HIGH (ref 11.5–15.5)
WBC: 14.5 10*3/uL — ABNORMAL HIGH (ref 4.0–10.5)

## 2016-03-26 LAB — COMPREHENSIVE METABOLIC PANEL
ALT: 20 U/L (ref 0–35)
AST: 14 U/L (ref 0–37)
Albumin: 4.7 g/dL (ref 3.5–5.2)
Alkaline Phosphatase: 84 U/L (ref 39–117)
BUN: 9 mg/dL (ref 6–23)
CO2: 28 mEq/L (ref 19–32)
Calcium: 10.1 mg/dL (ref 8.4–10.5)
Chloride: 105 mEq/L (ref 96–112)
Creatinine, Ser: 0.8 mg/dL (ref 0.40–1.20)
GFR: 100.15 mL/min (ref >60.00)
Glucose, Bld: 107 mg/dL — ABNORMAL HIGH (ref 70–99)
Potassium: 3.9 mEq/L (ref 3.5–5.1)
Sodium: 140 mEq/L (ref 135–145)
Total Bilirubin: 0.3 mg/dL (ref 0.2–1.2)
Total Protein: 8 g/dL (ref 6.0–8.3)

## 2016-03-26 LAB — LIPID PANEL
Cholesterol: 128 mg/dL (ref 0–200)
HDL: 45.3 mg/dL (ref >39.00)
LDL Cholesterol: 63 mg/dL (ref 0–99)
NonHDL: 82.54
Total CHOL/HDL Ratio: 3
Triglycerides: 98 mg/dL (ref 0.0–149.0)
VLDL: 19.6 mg/dL (ref 0.0–40.0)

## 2016-03-26 LAB — HEMOGLOBIN A1C: Hgb A1c MFr Bld: 6.9 % — ABNORMAL HIGH (ref 4.6–6.5)

## 2016-03-26 MED ORDER — BUPROPION HCL ER (SR) 150 MG PO TB12: ORAL_TABLET | ORAL | 2 refills | Status: DC

## 2016-03-26 MED ORDER — FLUTICASONE PROPIONATE 50 MCG/ACT NA SUSP: 2.0000 | Freq: Every day | NASAL | 3 refills | Status: DC

## 2016-03-26 NOTE — Patient Instructions (Addendum)
Eustachian Tube Dysfunction The eustachian tube connects the middle ear to the back of the nose. It regulates air pressure in the middle ear by allowing air to move between the ear and nose. It also helps to drain fluid from the middle ear space. When the eustachian tube does not function properly, air pressure, fluid, or both can build up in the middle ear. Eustachian tube dysfunction can affect one or both ears. What are the causes? This condition happens when the eustachian tube becomes blocked or cannot open normally. This may result from:  Ear infections.  Colds and other upper respiratory infections.  Allergies.  Irritation, such as from cigarette smoke or acid from the stomach coming up into the esophagus (gastroesophageal reflux).  Sudden changes in air pressure, such as from descending in an airplane.  Abnormal growths in the nose or throat, such as nasal polyps, tumors, or enlarged tissue at the back of the throat (adenoids). What increases the risk? This condition may be more likely to develop in people who smoke and people who are overweight. Eustachian tube dysfunction may also be more likely to develop in children, especially children who have:  Certain birth defects of the mouth, such as cleft palate.  Large tonsils and adenoids. What are the signs or symptoms? Symptoms of this condition may include:  A feeling of fullness in the ear.  Ear pain.  Clicking or popping noises in the ear.  Ringing in the ear.  Hearing loss.  Loss of balance. Symptoms may get worse when the air pressure around you changes, such as when you travel to an area of high elevation or fly on an airplane. How is this diagnosed? This condition may be diagnosed based on:  Your symptoms.  A physical exam of your ear, nose, and throat.  Tests, such as those that measure:  The movement of your eardrum (tympanogram).  Your hearing (audiometry). How is this treated? Treatment depends on  the cause and severity of your condition. If your symptoms are mild, you may be able to relieve your symptoms by moving air into ("popping") your ears. If you have symptoms of fluid in your ears, treatment may include:  Decongestants.  Antihistamines.  Nasal sprays or ear drops that contain medicines that reduce swelling (steroids). In some cases, you may need to have a procedure to drain the fluid in your eardrum (myringotomy). In this procedure, a small tube is placed in the eardrum to:  Drain the fluid.  Restore the air in the middle ear space. Follow these instructions at home:  Take over-the-counter and prescription medicines only as told by your health care provider.  Use techniques to help pop your ears as recommended by your health care provider. These may include:  Chewing gum.  Yawning.  Frequent, forceful swallowing.  Closing your mouth, holding your nose closed, and gently blowing as if you are trying to blow air out of your nose.  Do not do any of the following until your health care provider approves:  Travel to high altitudes.  Fly in airplanes.  Work in a pressurized cabin or room.  Scuba dive.  Keep your ears dry. Dry your ears completely after showering or bathing.  Do not smoke.  Keep all follow-up visits as told by your health care provider. This is important. Contact a health care provider if:  Your symptoms do not go away after treatment.  Your symptoms come back after treatment.  You are unable to pop your ears.    You have:  A fever.  Pain in your ear.  Pain in your head or neck.  Fluid draining from your ear.  Your hearing suddenly changes.  You become very dizzy.  You lose your balance. This information is not intended to replace advice given to you by your health care provider. Make sure you discuss any questions you have with your health care provider. Document Released: 02/07/2015 Document Revised: 06/19/2015 Document  Reviewed: 01/30/2014 Elsevier Interactive Patient Education  2017 [employer]vier Inc.  

## 2016-03-26 NOTE — Progress Notes (Signed)
Pre visit review using our clinic review tool, if applicable. No additional management support is needed unless otherwise documented below in the visit note. 

## 2016-03-27 ENCOUNTER — Encounter: Payer: Self-pay | Admitting: Family Medicine

## 2016-03-27 DIAGNOSIS — Z6830 Body mass index (BMI) 30.0-30.9, adult: Secondary | ICD-10-CM

## 2016-03-27 DIAGNOSIS — E6609 Other obesity due to excess calories: Secondary | ICD-10-CM | POA: Insufficient documentation

## 2016-03-27 DIAGNOSIS — F17200 Nicotine dependence, unspecified, uncomplicated: Secondary | ICD-10-CM | POA: Insufficient documentation

## 2016-03-27 DIAGNOSIS — F419 Anxiety disorder, unspecified: Secondary | ICD-10-CM | POA: Insufficient documentation

## 2016-03-27 NOTE — Progress Notes (Signed)
Subjective:    Anna Huffman is a 46 y.o. female and is here for a comprehensive physical exam.  1. Screening for cervical cancer. Due for PAP.  2. Prediabetes, Hx of. Due for A1c.  3. Class 1 obesity due to excess calories without serious comorbidity with body mass index (BMI) of 30.0 to 30.9 in adult. Does not watch diet. No regular exercise.  4. Right ear congestion. For weeks, worse with allergy season. No change in hearing or dizziness. No treatment. Hx of the same. Some mild rhinitis and PND.   5. Smoker. Ready to quit. Failed Chantix previously. Interested in [place].    Patient Active Problem List   Diagnosis Date Noted  . Class 1 obesity due to excess calories without serious comorbidity with body mass index (BMI) of 30.0 to 30.9 in adult 03/27/2016  . Smoker 03/27/2016  . Anxiety   . 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 Perimenopause 07/23/2013  . Prediabetes 07/23/2013   Past Surgical History:  Procedure Laterality Date  . BREAST REDUCTION SURGERY     Social History   Social History  . Marital status: Single    Spouse name: N/A  . Number of children: N/A  . Years of education: N/A   Occupational History  . Customer Service [employer]   Social History Main Topics  . Smoking status: Current Every Day Smoker    Packs/day: 0.50    Types: Cigarettes  . Smokeless tobacco: Never Used  . Alcohol use Yes     Comment: social   . Drug use: Yes    Types: Marijuana  . Sexual activity: Yes    Partners: Male   Other Topics Concern  . None   Social History Narrative  . None   No family history on file. Allergies  Allergen Reactions  . Codeine Itching   Medications: None.  Review of Systems: The patient denies chills, fever, weight loss/gain, fatigue, lack of appetite, difficulty swallowing, sore throat, earache, post-nasal drip, chest pain, palpitations, changes in blood pressures, swelling of legs, cough, dyspnea, wheezing, change in bowel habits, nausea,  vomiting, changes in urination, joint or muscle pain, skin changes, dizziness, headaches, numbness, changes in balance or coordination, anxiety, depression, memory changes, swollen glands, easy bruising.    Objective:   BP 122/86   Pulse 86   Temp 97.8 F (36.6 C) (Oral)   Ht 5' 1.5" (1.562 m)   Wt 168 lb 6.4 oz (76.4 kg)   SpO2 97%   BMI 31.30 kg/m  Body mass index is 31.3 kg/m.   General Appearance:    Alert, cooperative, no distress, appears stated age  Head:    Normocephalic, without obvious abnormality, atraumatic  Eyes:    PERRL, conjunctiva/corneas clear, EOM's intact, fundi    benign, both eyes  Ears:    Normal TM's and external ear canals, both ears  Nose:   Nares normal, septum midline, mucosa normal, no drainage    or sinus tenderness  Throat:   Lips, mucosa, and tongue normal; teeth and gums normal  Neck:   Supple, symmetrical, trachea midline, no adenopathy;    thyroid:  no enlargement/tenderness/nodules; no carotid   bruit or JVD  Back:     Symmetric, no curvature, ROM normal, no CVA tenderness  Lungs:     Clear to auscultation bilaterally, respirations unlabored  Chest Wall:    No tenderness or deformity   Heart:    Regular rate and rhythm, S1 and S2 normal, no murmur,  rub   or gallop  Abdomen:     Soft, non-tender, bowel sounds active all four quadrants,    no masses, no organomegaly  Genitalia:    Normal female without lesion, discharge or tenderness  Extremities:   Extremities normal, atraumatic, no cyanosis or edema  Pulses:   2+ and symmetric all extremities  Skin:   Skin color, texture, turgor normal, no rashes or lesions  Lymph nodes:   Cervical, supraclavicular, and axillary nodes normal  Neurologic:   CNII-XII intact, normal strength, sensation and reflexes    throughout     Assessment/Plan:    Diagnoses and all orders for this visit:  Screening for cervical cancer -     Cytology - PAP  Prediabetes -     Hemoglobin A1c  Class 1 obesity  due to excess calories without serious comorbidity with body mass index (BMI) of 30.0 to 30.9 in adult -     Lipid panel  Encounter for routine history and physical exam in female patient -     Comprehensive metabolic panel -     CBC with Differential/Platelet  Dysfunction of right eustachian tube -     fluticasone (FLONASE) 50 MCG/ACT nasal spray; Place 2 sprays into both nostrils daily.  Smoker -     buPROPion (WELLBUTRIN SR) 150 MG 12 hr tablet; Take one tablet by mouth daily for 3 days, and then increase to one tablet by mouth twice daily.   Well Adult Exam: Labs ordered: Yes. Patient counseling was done. See below for items discussed. Discussed the patient's BMI.  The BMI BMI is not in the acceptable range; BMI management plan is completed Follow up in 1 month.    Patient Counseling: [x]    Nutrition: Stressed importance of moderation in sodium/caffeine intake, saturated fat and cholesterol, caloric balance, sufficient intake of fresh fruits, vegetables, fiber, calcium, iron, and 1 mg of folate supplement per day (for females capable of pregnancy).  [x]    Stressed the importance of regular exercise.   [x]    Substance Abuse: Discussed cessation/primary prevention of tobacco, alcohol, or other drug use; driving or other dangerous activities under the influence; availability of treatment for abuse.   [x]    Injury prevention: Discussed safety belts, safety helmets, smoke detector, smoking near bedding or upholstery.   [x]    Sexuality: Discussed sexually transmitted diseases, partner selection, use of condoms, avoidance of unintended pregnancy  and contraceptive alternatives.  [x]    Dental health: Discussed importance of regular tooth brushing, flossing, and dental visits.  [x]    Health maintenance and immunizations reviewed. Please refer to Health maintenance section.

## 2016-03-29 LAB — CYTOLOGY - PAP
Bacterial vaginitis: POSITIVE — AB
Candida vaginitis: NEGATIVE
Diagnosis: NEGATIVE

## 2016-03-29 MED ORDER — METRONIDAZOLE 500 MG PO TABS: 500.0000 mg | ORAL_TABLET | Freq: Two times a day (BID) | ORAL | 0 refills | Status: DC

## 2016-03-29 NOTE — Addendum Note (Signed)
Addended by: Briscoe Deutscher R on: 03/29/2016 07:04 PM   Modules accepted: Orders

## 2016-03-30 ENCOUNTER — Telehealth: Payer: Self-pay | Admitting: Family Medicine

## 2016-03-30 NOTE — Telephone Encounter (Signed)
Spoke with patient and gave results.  Verbalized understanding.

## 2016-03-30 NOTE — Telephone Encounter (Signed)
Patient returning call about lab results. Please leave patient a detailed message with lab results on voicemail due to patient being at work.

## 2016-04-09 ENCOUNTER — Ambulatory Visit (INDEPENDENT_AMBULATORY_CARE_PROVIDER_SITE_OTHER): Payer: PRIVATE HEALTH INSURANCE | Admitting: Family Medicine

## 2016-04-09 ENCOUNTER — Encounter: Payer: Self-pay | Admitting: Family Medicine

## 2016-04-09 VITALS — BP 124/82 | HR 92 | Temp 98.2°F | Wt 167.6 lb

## 2016-04-09 DIAGNOSIS — F17200 Nicotine dependence, unspecified, uncomplicated: Secondary | ICD-10-CM

## 2016-04-09 DIAGNOSIS — Z6830 Body mass index (BMI) 30.0-30.9, adult: Secondary | ICD-10-CM | POA: Diagnosis not present

## 2016-04-09 DIAGNOSIS — E6609 Other obesity due to excess calories: Secondary | ICD-10-CM

## 2016-04-09 DIAGNOSIS — E119 Type 2 diabetes mellitus without complications: Secondary | ICD-10-CM

## 2016-04-09 DIAGNOSIS — E66811 Obesity, class 1: Secondary | ICD-10-CM

## 2016-04-09 LAB — MICROALBUMIN / CREATININE URINE RATIO
Creatinine,U: 68.8 mg/dL
Microalb Creat Ratio: 1 mg/g (ref 0.0–30.0)
Microalb, Ur: <0.7 mg/dL (ref 0.0–1.9)

## 2016-04-09 MED ORDER — METFORMIN HCL ER 500 MG PO TB24: 1000.0000 mg | ORAL_TABLET | Freq: Every day | ORAL | 2 refills | Status: DC

## 2016-04-09 NOTE — Progress Notes (Signed)
Pre visit review using our clinic review tool, if applicable. No additional management support is needed unless otherwise documented below in the visit note. 

## 2016-04-09 NOTE — Progress Notes (Signed)
Anna Huffman is a 46 y.o. female is here to discuss:  History of Present Illness:   Chief Complaint  Patient presents with  . Diabetes    Elevated A1c on recent lab work - new onset.    HPI:  New Diagnosis of DM: Current symptoms: No polyuria, polydipsia, chest pain, dyspnea or claudication.  No foot burning, numbness or pain. Some blurry vision. Current diet: in general, a "healthy" diet. Current exercise: none.  Smoker: Working on cessation. Taking Wellbutrin. Helping with cravings.   There are no preventive care reminders to display for this patient.  PMHx, SurgHx, SocialHx, FamHx, Medications, and Allergies were reviewed in the Visit Navigator and updated as appropriate.   Patient Active Problem List   Diagnosis Date Noted  . Class 1 obesity due to excess calories without serious comorbidity with body mass index (BMI) of 30.0 to 30.9 in adult 03/27/2016  . Smoker 03/27/2016  . Anxiety   . 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 Perimenopause 07/23/2013  . Prediabetes 07/23/2013    Social History  Substance Use Topics  . Smoking status: Current Every Day Smoker    Packs/day: 0.50    Types: Cigarettes  . Smokeless tobacco: Never Used  . Alcohol use Yes     Comment: social     Current Medications and Allergies:    Current Outpatient Prescriptions:  .  buPROPion (WELLBUTRIN SR) 150 MG 12 hr tablet, Take one tablet by mouth daily for 3 days, and then increase to one tablet by mouth twice daily., Disp: 60 tablet, Rfl: 2 .  fluticasone (FLONASE) 50 MCG/ACT nasal spray, Place 2 sprays into both nostrils daily., Disp: 16 g, Rfl: 3  Allergies  Allergen Reactions  . Codeine     itching    Review of Systems   Review of Systems  Constitutional: Positive for malaise/fatigue. Negative for chills and fever.       Fatigue daily  HENT: Negative for congestion, ear pain, sinus pain and sore throat.   Eyes: Positive for blurred vision.       Sometimes for a week.     Respiratory: Negative for cough, shortness of breath and wheezing.   Cardiovascular: Negative for chest pain, palpitations and leg swelling.  Gastrointestinal: Negative for constipation, nausea and vomiting.  Genitourinary: Negative for dysuria.  Musculoskeletal: Negative for back pain and neck pain.  Neurological: Negative for headaches.  Psychiatric/Behavioral: Negative for depression.    Vitals:   Today's Vitals   04/09/16 1022  BP: 124/82  Pulse: 92  Temp: 98.2 F (36.8 C)  TempSrc: Oral  SpO2: 97%  Weight: 167 lb 9.6 oz (76 kg)    Physical Exam:    Physical Exam  Constitutional: She is oriented to person, place, and time. She appears well-developed and well-nourished. No distress.  HENT:  Head: Normocephalic and atraumatic.  Eyes: Conjunctivae and EOM are normal. Pupils are equal, round, and reactive to light.  Neck: No thyromegaly present.  Cardiovascular: Normal rate, regular rhythm, normal heart sounds and intact distal pulses.   Pulmonary/Chest: Effort normal and breath sounds normal.  Abdominal: Soft. Bowel sounds are normal.  Genitourinary: Uterus normal.  Neurological: She is alert and oriented to person, place, and time.  Skin: Skin is warm and dry.  Psychiatric: She has a normal mood and affect. Her behavior is normal. Judgment and thought content normal.   Results for orders placed or performed in visit on 04/09/16  Microalbumin / creatinine urine ratio  Result Value  Ref Range   Microalb, Ur <0.7 0.0 - 1.9 mg/dL   Creatinine,U 68.8 mg/dL   Microalb Creat Ratio 1.0 0.0 - 30.0 mg/g    Assessment and Plan:   Anna Huffman was seen today for diabetes.  Diagnoses and all orders for this visit:  Type 2 diabetes mellitus without complication, without long-term current use of insulin (HCC) Comments: New diagnosis and new medication. After discussion, patient would like to start below medication. Expectations, risks, and potential side effects reviewed. Will  recheck labs - see AVS for instructions. No ACEI at this time (normal BP, no microalbuminuria).  Orders: -     metFORMIN (GLUCOPHAGE XR) 500 MG 24 hr tablet; Take 2 tablets (1,000 mg total) by mouth daily with breakfast. -     Referral to Nutrition and Diabetes Services -     Ambulatory referral to Ophthalmology -     Microalbumin / creatinine urine ratio  Smoker Comments: Patient to continue to work on smoking cessation.   . Reviewed expectations re: course of current medical issues. . Discussed self-management of symptoms. . Outlined signs and symptoms indicating need for more acute intervention. . Patient verbalized understanding and all questions were answered. . See orders for this visit as documented in the electronic medical record. . Patient received an After Visit Summary.   Shaune Pascal CMA acting as scribe for Dr. Juleen China.  CMA served as Education administrator during this visit. History, Physical, and Plan performed by medical provider. Documentation and orders reviewed and attested to. Briscoe Deutscher, D.O.  Briscoe Deutscher, [place], [place] 04/09/2016  Follow-up: 3 months.

## 2016-04-21 ENCOUNTER — Encounter: Payer: Self-pay | Admitting: Surgical

## 2016-04-21 ENCOUNTER — Ambulatory Visit (INDEPENDENT_AMBULATORY_CARE_PROVIDER_SITE_OTHER): Payer: PRIVATE HEALTH INSURANCE | Admitting: Family Medicine

## 2016-04-21 ENCOUNTER — Encounter: Payer: Self-pay | Admitting: Family Medicine

## 2016-04-21 VITALS — BP 124/82 | HR 102 | Temp 98.0°F | Wt 169.2 lb

## 2016-04-21 DIAGNOSIS — E119 Type 2 diabetes mellitus without complications: Secondary | ICD-10-CM | POA: Diagnosis not present

## 2016-04-21 DIAGNOSIS — J301 Allergic rhinitis due to pollen: Secondary | ICD-10-CM

## 2016-04-21 DIAGNOSIS — L270 Generalized skin eruption due to drugs and medicaments taken internally: Secondary | ICD-10-CM

## 2016-04-21 MED ORDER — HYDROXYZINE HCL 25 MG PO TABS: 25.0000 mg | ORAL_TABLET | Freq: Four times a day (QID) | ORAL | 0 refills | Status: DC | PRN

## 2016-04-21 MED ORDER — PREDNISONE 5 MG PO TABS
ORAL_TABLET | ORAL | 0 refills | Status: DC
Start: 2016-04-21 — End: 2016-06-14

## 2016-04-21 NOTE — Progress Notes (Signed)
Pre visit review using our clinic review tool, if applicable. No additional management support is needed unless otherwise documented below in the visit note. 

## 2016-04-21 NOTE — Progress Notes (Signed)
Anna Huffman is a 46 y.o. female here for a new problem.  History of Present Illness:   Anna Huffman CMA acting as scribe for Dr. Juleen Huffman.  Chief Complaint  Patient presents with  . Rash    Rash all over the body and itching since Saturday.   Rash  This is a new problem. The current episode started in the past 7 days. The problem has been gradually improving since onset. The rash is characterized by itchiness. She was exposed to a new medication. Associated symptoms include diarrhea. Pertinent negatives include no congestion, cough, facial edema, fever, joint pain, shortness of breath, sore throat or vomiting. Past treatments include antihistamine. The treatment provided mild relief.   PMHx, SurgHx, SocialHx, Medications, and Allergies were reviewed in the Visit Navigator and updated as appropriate.  Current Medications:   Current Outpatient Prescriptions:  .  fluticasone (FLONASE) 50 MCG/ACT nasal spray, Place 2 sprays into both nostrils daily., Disp: 16 g, Rfl: 3 .  metFORMIN (GLUCOPHAGE XR) 500 MG 24 hr tablet, Take 2 tablets (1,000 mg total) by mouth daily with breakfast., Disp: 60 tablet, Rfl: 2 .  buPROPion (WELLBUTRIN SR) 150 MG 12 hr tablet, Take one tablet by mouth daily for 3 days, and then increase to one tablet by mouth twice daily. (Patient not taking: Reported on 04/21/2016), Disp: 60 tablet, Rfl: 2   NOTE: THE PATIENT STOPPED ALL MEDICATIONS A FEW DAYS AGO.  Review of Systems:   Review of Systems  Constitutional: Negative for chills, fever and malaise/fatigue.  HENT: Negative for congestion, ear pain and sore throat.   Eyes: Negative for blurred vision and double vision.  Respiratory: Negative for cough and shortness of breath.   Cardiovascular: Negative for chest pain, palpitations and leg swelling.  Gastrointestinal: Positive for diarrhea. Negative for abdominal pain and vomiting.       Patient thinks this is coming from the metformin.  Genitourinary: Negative  for dysuria.  Musculoskeletal: Negative for back pain, joint pain and neck pain.  Skin: Positive for itching and rash.       Itching all over her body.   Neurological: Negative for headaches.  Psychiatric/Behavioral: Negative for depression and memory loss.    Vitals:   Vitals:   04/21/16 1256  BP: 124/82  Pulse: (!) 102  Temp: 98 F (36.7 C)  TempSrc: Oral  SpO2: 99%  Weight: 169 lb 3.2 oz (76.7 kg)     Body mass index is 31.45 kg/m.  Physical Exam:   Physical Exam  Constitutional: She appears well-nourished.  HENT:  Head: Normocephalic and atraumatic.  Eyes: EOM are normal. Pupils are equal, round, and reactive to light.  Neck: Normal range of motion. Neck supple.  Cardiovascular: Normal rate, regular rhythm, normal heart sounds and intact distal pulses.   Pulmonary/Chest: Effort normal.  Abdominal: Soft.  Skin: Skin is warm.  Psychiatric: She has a normal mood and affect. Her behavior is normal.  Nursing note and vitals reviewed.   Assessment and Plan:    Anna Huffman was seen today for rash.  Diagnoses and all orders for this visit:  Drug eruption Comments: Patient started two medications around the same time. Holding both for now. See treatment below. Will check in on her next week to make sure improved and adjust treatment plan for depression, diabetes, and weight loss.  Orders: -     predniSONE (DELTASONE) 5 MG tablet; 6-5-4-3-2-1-off -     hydrOXYzine (ATARAX/VISTARIL) 25 MG tablet; Take 1 tablet (25 mg  total) by mouth every 6 (six) hours as needed for itching (insomnia). -     Comprehensive metabolic panel; Future   . Reviewed expectations re: course of current medical issues. . Discussed self-management of symptoms. . Outlined signs and symptoms indicating need for more acute intervention. . Patient verbalized understanding and all questions were answered. . See orders for this visit as documented in the electronic medical record. . Patient received an  After-Visit Summary.  CMA served as Education administrator during this visit. History, Physical, and Plan performed by medical provider. Documentation and orders reviewed and attested to. Anna Huffman, D.O.  Anna Huffman, D.O.

## 2016-04-23 DIAGNOSIS — J302 Other seasonal allergic rhinitis: Secondary | ICD-10-CM | POA: Insufficient documentation

## 2016-04-23 DIAGNOSIS — E119 Type 2 diabetes mellitus without complications: Secondary | ICD-10-CM | POA: Insufficient documentation

## 2016-04-26 ENCOUNTER — Ambulatory Visit: Payer: PRIVATE HEALTH INSURANCE | Admitting: Family Medicine

## 2016-04-27 ENCOUNTER — Telehealth: Payer: Self-pay | Admitting: Surgical

## 2016-04-27 NOTE — Telephone Encounter (Signed)
Spoke with about rash and she said that it is getting better. I have scheduled her to come in for appointment next week to discuss Victoza.

## 2016-04-27 NOTE — Telephone Encounter (Signed)
-----   Message from Briscoe Deutscher, DO sent at 04/23/2016  1:07 PM EDT ----- Please ask the patient to come back in for a visit when her rash has resolved. We can talk about trying Victoza. Give her a heads up about it - let her look it up. Good for diabetes and weight loss. Warn her that it may need a prior auth, but she failed metformin so should work.

## 2016-05-06 ENCOUNTER — Ambulatory Visit: Payer: PRIVATE HEALTH INSURANCE | Admitting: Family Medicine

## 2016-06-11 ENCOUNTER — Telehealth: Payer: Self-pay | Admitting: Family Medicine

## 2016-06-11 ENCOUNTER — Encounter (HOSPITAL_COMMUNITY): Payer: Self-pay

## 2016-06-11 ENCOUNTER — Emergency Department (HOSPITAL_COMMUNITY): Payer: PRIVATE HEALTH INSURANCE

## 2016-06-11 ENCOUNTER — Emergency Department (HOSPITAL_COMMUNITY)
Admission: EM | Admit: 2016-06-11 | Discharge: 2016-06-11 | Disposition: A | Discharge status: Home / Self Care | Payer: PRIVATE HEALTH INSURANCE | Attending: Emergency Medicine | Admitting: Emergency Medicine

## 2016-06-11 DIAGNOSIS — R109 Unspecified abdominal pain: Secondary | ICD-10-CM | POA: Diagnosis not present

## 2016-06-11 DIAGNOSIS — X58XXXA Exposure to other specified factors, initial encounter: Secondary | ICD-10-CM | POA: Diagnosis not present

## 2016-06-11 DIAGNOSIS — E119 Type 2 diabetes mellitus without complications: Secondary | ICD-10-CM | POA: Diagnosis not present

## 2016-06-11 DIAGNOSIS — S39012A Strain of muscle, fascia and tendon of lower back, initial encounter: Secondary | ICD-10-CM | POA: Diagnosis not present

## 2016-06-11 DIAGNOSIS — Y929 Unspecified place or not applicable: Secondary | ICD-10-CM | POA: Diagnosis not present

## 2016-06-11 DIAGNOSIS — Z7984 Long term (current) use of oral hypoglycemic drugs: Secondary | ICD-10-CM | POA: Insufficient documentation

## 2016-06-11 DIAGNOSIS — Y939 Activity, unspecified: Secondary | ICD-10-CM | POA: Diagnosis not present

## 2016-06-11 DIAGNOSIS — Z91040 Latex allergy status: Secondary | ICD-10-CM | POA: Diagnosis not present

## 2016-06-11 DIAGNOSIS — F17210 Nicotine dependence, cigarettes, uncomplicated: Secondary | ICD-10-CM | POA: Diagnosis not present

## 2016-06-11 DIAGNOSIS — Y999 Unspecified external cause status: Secondary | ICD-10-CM | POA: Insufficient documentation

## 2016-06-11 DIAGNOSIS — S3992XA Unspecified injury of lower back, initial encounter: Secondary | ICD-10-CM | POA: Diagnosis present

## 2016-06-11 LAB — URINALYSIS, ROUTINE W REFLEX MICROSCOPIC
Bilirubin Urine: NEGATIVE
GLUCOSE, UA: NEGATIVE mg/dL
HGB URINE DIPSTICK: NEGATIVE
Ketones, ur: NEGATIVE mg/dL
LEUKOCYTES UA: NEGATIVE
Nitrite: NEGATIVE
PROTEIN: NEGATIVE mg/dL
Specific Gravity, Urine: 1.002 — ABNORMAL LOW (ref 1.005–1.030)
pH: 8 (ref 5.0–8.0)

## 2016-06-11 LAB — I-STAT CHEM 8, ED
BUN: 4 mg/dL — ABNORMAL LOW (ref 6–20)
CALCIUM ION: 1.15 mmol/L (ref 1.15–1.40)
Chloride: 105 mmol/L (ref 101–111)
Creatinine, Ser: 0.7 mg/dL (ref 0.44–1.00)
GLUCOSE: 89 mg/dL (ref 65–99)
HCT: 41 % (ref 36.0–46.0)
HEMOGLOBIN: 13.9 g/dL (ref 12.0–15.0)
Potassium: 3.8 mmol/L (ref 3.5–5.1)
Sodium: 141 mmol/L (ref 135–145)
TCO2: 30 mmol/L (ref 0–100)

## 2016-06-11 MED ORDER — ONDANSETRON HCL 4 MG/2ML IJ SOLN
4.0000 mg | Freq: Once | INTRAMUSCULAR | Status: AC
  Administered 2016-06-11: 4 mg via INTRAVENOUS
  Filled 2016-06-11: qty 2

## 2016-06-11 MED ORDER — SODIUM CHLORIDE 0.9 % IV BOLUS (SEPSIS)
1000.0000 mL | Freq: Once | INTRAVENOUS | Status: AC
  Administered 2016-06-11: 1000 mL via INTRAVENOUS

## 2016-06-11 MED ORDER — NAPROXEN 500 MG PO TABS
500.0000 mg | ORAL_TABLET | Freq: Once | ORAL | Status: AC
  Administered 2016-06-11: 500 mg via ORAL
  Filled 2016-06-11: qty 1

## 2016-06-11 MED ORDER — METHOCARBAMOL 500 MG PO TABS
500.0000 mg | ORAL_TABLET | Freq: Once | ORAL | Status: AC
  Administered 2016-06-11: 500 mg via ORAL
  Filled 2016-06-11: qty 1

## 2016-06-11 MED ORDER — NAPROXEN 500 MG PO TABS: 500.0000 mg | ORAL_TABLET | Freq: Two times a day (BID) | ORAL | 0 refills | Status: DC

## 2016-06-11 MED ORDER — METHOCARBAMOL 500 MG PO TABS: 500.0000 mg | ORAL_TABLET | Freq: Every evening | ORAL | 0 refills | Status: DC | PRN

## 2016-06-11 MED ORDER — MORPHINE SULFATE (PF) 4 MG/ML IV SOLN
4.0000 mg | Freq: Once | INTRAVENOUS | Status: AC
  Administered 2016-06-11: 4 mg via INTRAVENOUS
  Filled 2016-06-11: qty 1

## 2016-06-11 NOTE — ED Provider Notes (Signed)
[facility] DEPT Provider Note   CSN: [id number] Arrival date & time: 06/11/16  1013     History   Chief Complaint Chief Complaint  Patient presents with  . Flank Pain  . Emesis    HPI Anna Huffman is a 46 y.o. female presenting with sudden onset right sided flank pain for the last 2 days accompanied with nausea and vomiting. She denies any history of kidney stones. He hasn't tried anything for the pain. She reports dark urine yesterday and has been drinking a lot of water, Gatorade and baking soda as she thought it might have been gas. She experiences some temporary relief when urinating and the pain returns. She cannot find a comfortable position. She also reports loose stools but should use it to metformin. She endorses chills but no fever, denies abdominal pain, dysuria, hematuria or other symptoms. HPI  Past Medical History:  Diagnosis Date  . Anemia   . Anxiety   . Blood transfusion without reported diagnosis   . Heart murmur     Patient Active Problem List   Diagnosis Date Noted  . Type 2 diabetes mellitus without complication, without long-term current use of insulin ([place]) 04/23/2016  . Seasonal allergies 04/23/2016  . Class 1 obesity due to excess calories without serious comorbidity with body mass index (BMI) of 30.0 to 30.9 in adult 03/27/2016  . Smoker 03/27/2016  . Anxiety   . 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 Perimenopause 07/23/2013    Past Surgical History:  Procedure Laterality Date  . BREAST REDUCTION SURGERY      OB History    No data available       Home Medications    Prior to Admission medications   Medication Sig Start Date End Date Taking? Authorizing Provider  fluticasone (FLONASE) 50 MCG/ACT nasal spray Place 2 sprays into both nostrils daily. 03/26/16  Yes Briscoe Deutscher, DO  metFORMIN (GLUCOPHAGE-XR) 500 MG 24 hr tablet Take 500 mg by mouth 2 (two) times daily. 06/10/16  Yes [provider]  hydrOXYzine (ATARAX/VISTARIL) 25  MG tablet Take 1 tablet (25 mg total) by mouth every 6 (six) hours as needed for itching (insomnia). Patient not taking: Reported on 06/11/2016 04/21/16   Briscoe Deutscher, DO  methocarbamol (ROBAXIN) 500 MG tablet Take 1 tablet (500 mg total) by mouth at bedtime as needed for muscle spasms. 06/11/16   Avie Echevaria B, PA-C  naproxen (NAPROSYN) 500 MG tablet Take 1 tablet (500 mg total) by mouth 2 (two) times daily with a meal. 06/11/16   Avie Echevaria B, PA-C  predniSONE (DELTASONE) 5 MG tablet 6-5-4-3-2-1-off Patient not taking: Reported on 06/11/2016 04/21/16   Briscoe Deutscher, DO    Family History History reviewed. No pertinent family history.  Social History Social History  Substance Use Topics  . Smoking status: Current Every Day Smoker    Packs/day: 0.50    Types: Cigarettes  . Smokeless tobacco: Never Used  . Alcohol use Yes     Comment: social      Allergies   Codeine; Latex; and Wellbutrin [bupropion]   Review of Systems Review of Systems  Constitutional: Positive for chills. Negative for fever.  HENT: Positive for congestion. Negative for ear pain and sore throat.   Eyes: Negative for pain and visual disturbance.  Respiratory: Positive for cough. Negative for shortness of breath, wheezing and stridor.   Cardiovascular: Negative for chest pain and palpitations.  Gastrointestinal: Positive for diarrhea, nausea and vomiting. Negative for abdominal distention, abdominal pain  and blood in stool.       Reporting loose stools from metformin, not new or associated with current symptoms. Nausea vomiting post tussive  Genitourinary: Positive for flank pain. Negative for difficulty urinating, dysuria, frequency, hematuria, menstrual problem, pelvic pain, urgency and vaginal discharge.  Musculoskeletal: Negative for arthralgias, back pain, myalgias, neck pain and neck stiffness.  Skin: Negative for color change, pallor and rash.  Neurological: Negative for seizures and syncope.       Physical Exam Updated Vital Signs BP (!) 146/107 (BP Location: Left Arm)   Pulse 89   Temp 98 F (36.7 C) (Oral)   Resp 18   SpO2 99%   Physical Exam  Constitutional: She appears well-developed and well-nourished. No distress.  Patient is afebrile, nontoxic-appearing, lying uncomfortably in bed in no acute distress.  HENT:  Head: Normocephalic and atraumatic.  Eyes: Conjunctivae and EOM are normal. Right eye exhibits no discharge. Left eye exhibits no discharge. No scleral icterus.  Neck: Normal range of motion.  Cardiovascular: Normal rate, regular rhythm and normal heart sounds.   No murmur heard. Pulmonary/Chest: Effort normal and breath sounds normal. No respiratory distress. She has no wheezes. She has no rales. She exhibits no tenderness.  Abdominal: Soft. She exhibits no distension and no mass. There is no tenderness. There is no rebound and no guarding.  Abdomen is soft and nontender. No cva tenderness  Musculoskeletal: Normal range of motion. She exhibits no edema.  Neurological: She is alert. No sensory deficit. She exhibits normal muscle tone. Coordination normal.  Normal stance and gait.  Skin: Skin is warm and dry. No rash noted. She is not diaphoretic. No erythema. No pallor.  Psychiatric: She has a normal mood and affect.  Nursing note and vitals reviewed.    ED Treatments / Results  Labs (all labs ordered are listed, but only abnormal results are displayed) Labs Reviewed  URINALYSIS, ROUTINE W REFLEX MICROSCOPIC - Abnormal; Notable for the following:       Result Value   Color, Urine COLORLESS (*)    Specific Gravity, Urine 1.002 (*)    All other components within normal limits  I-STAT CHEM 8, ED - Abnormal; Notable for the following:    BUN 4 (*)    All other components within normal limits    EKG  EKG Interpretation None       Radiology Dg Chest 2 View  Result Date: 06/11/2016 CLINICAL DATA:  46 year old female with productive cough for  over a month. Smoker. Right lower abdominal pain. EXAM: CHEST  2 VIEW COMPARISON:  Chest CTA and radiographs 12/11/2005. CT Abdomen and Pelvis from today reported separately. FINDINGS: Semi upright AP and lateral views of the chest. Lower lung volumes than in 2007. Mediastinal contours remain normal. Visualized tracheal air column is within normal limits. No pneumothorax, pulmonary edema, pleural effusion or confluent pulmonary opacity. Negative visible bowel gas pattern. No acute osseous abnormality identified. IMPRESSION: Low lung volumes, otherwise no acute cardiopulmonary abnormality. Electronically Signed   By: Genevie Ann M.D.   On: 06/11/2016 12:51   Ct Renal Stone Study  Result Date: 06/11/2016 CLINICAL DATA:  Left-sided back pain.  Evaluate for renal calculi. EXAM: CT ABDOMEN AND PELVIS WITHOUT CONTRAST TECHNIQUE: Multidetector CT imaging of the abdomen and pelvis was performed following the standard protocol without IV contrast. COMPARISON:  None. FINDINGS: Lower chest:  No contributory findings. Hepatobiliary: Patchy hepatic steatosis. No evidence of focal lesion.No evidence of biliary obstruction or stone. Pancreas: Unremarkable. Spleen:  Unremarkable. Adrenals/Urinary Tract: Negative adrenals. No hydronephrosis or stone. Unremarkable bladder. Stomach/Bowel:  No obstruction. No appendicitis. Vascular/Lymphatic: No acute vascular abnormality. Multifocal atherosclerotic calcification at the bilateral iliacs. No mass or adenopathy. Reproductive:Negative Other: No ascites or pneumoperitoneum. Musculoskeletal: Negative IMPRESSION: 1. No acute finding.  No hydronephrosis or urolithiasis. 2. Hepatic steatosis. 3. Iliac atherosclerotic calcification. Electronically Signed   By: Monte Fantasia M.D.   On: 06/11/2016 12:37    Procedures Procedures (including critical care time)  Medications Ordered in ED Medications  methocarbamol (ROBAXIN) tablet 500 mg (not administered)  naproxen (NAPROSYN) tablet 500  mg (not administered)  ondansetron (ZOFRAN) injection 4 mg (4 mg Intravenous Given 06/11/16 1203)  sodium chloride 0.9 % bolus 1,000 mL (0 mLs Intravenous Stopped 06/11/16 1323)  morphine 4 MG/ML injection 4 mg (4 mg Intravenous Given 06/11/16 1203)     Initial Impression / Assessment and Plan / ED Course  I have reviewed the triage vital signs and the nursing notes.  Pertinent labs & imaging results that were available during my care of the patient were reviewed by me and considered in my medical decision making (see chart for details).    Patient presents with right flank pain/low back pain. Due to the location of the pain and productive cough persisting. Ordered chest xray. Ordered CT renal, fluids and pain management. Will reassess.  CT renal negative and CXR negative. Pain is reproducible to palpation on reassessment. The pain appears to be due to muscle strain. Patient reports possibly pulling her back with the way she has been working. Urine clear and chem 8 unremarkable  Patient presents with lower back pain.  No gross neurological deficits and normal neuro exam.  Patient has no gait abnormality or concern for cauda equina.  No loss of bowel or bladder control, fever, night sweats, weight loss, h/o malignancy, or IVDU.  RICE protocol and pain medications indicated and discussed with patient.   Discharge home with symptomatic relief and back exercises with close follow-up with PCP. Patient was stable and well-appearing. She was ready to go home.  Discussed strict return precautions and advised to return to the emergency department if experiencing any new or worsening symptoms. Instructions were understood and patient agreed with discharge plan. Final Clinical Impressions(s) / ED Diagnoses   Final diagnoses:  Right flank pain  Strain of lumbar region, initial encounter    New Prescriptions New Prescriptions   METHOCARBAMOL (ROBAXIN) 500 MG TABLET    Take 1 tablet (500 mg total)  by mouth at bedtime as needed for muscle spasms.   NAPROXEN (NAPROSYN) 500 MG TABLET    Take 1 tablet (500 mg total) by mouth 2 (two) times daily with a meal.     Emeline General, PA-C 06/11/16 1451    Pattricia Boss, MD 06/12/16 1200

## 2016-06-11 NOTE — Telephone Encounter (Signed)
Patient called stating she needed to speak to nurse, due to severe kidney/ lower abdominal pain. Sent to [facility] for triage.

## 2016-06-11 NOTE — ED Triage Notes (Signed)
Pt with rt flank pain x 2 days.  N/V.

## 2016-06-11 NOTE — Discharge Instructions (Signed)
As discussed, please follow up with your primary care provider if symptoms persist. Continue to drink plenty of fluids and keep your urine clear.  Use naproxen for pain and muscle relaxant as needed at night time. The medicine prescribed can help with muscle spasm but cannot betaken if driving, with alcohol or operating machinery.

## 2016-06-11 NOTE — Telephone Encounter (Signed)
Patient Name: Anna Huffman DOB: 1970/10/16 Initial Comment Caller states she's having lower back pain, around her Kidney area. She has been vomiting. Urine is dark, feels somewhat better after urinating. Nurse Assessment Nurse: Cherie Dark RN, Jarrett Soho Date/Time (Eastern Time): 06/11/2016 9:38:53 AM Confirm and document reason for call. If symptomatic, describe symptoms. ---Caller states she is having some pain around her right flank where her kidney is. She has vomited twice this am and had some dark urine this am. Pain is an 8. No pain when urinating or burning. All day yesterday and got worse during the night. Does the patient have any new or worsening symptoms? ---Yes Will a triage be completed? ---Yes Related visit to physician within the last 2 weeks? ---No Does the PT have any chronic conditions? (i.e. diabetes, asthma, etc.) ---Yes List chronic conditions. ---diabetes, Is the patient pregnant or possibly pregnant? (Ask all females between the ages of 23-55) ---No Is this a behavioral health or substance abuse call? ---No Guidelines Guideline Title Affirmed Question Affirmed Notes Flank Pain [1] SEVERE pain (e.g., excruciating, scale 8-10) AND [2] present > 1 hour Final Disposition User Go to ED Now Cherie Dark, RN, Jarrett Soho Referrals [facility] - ED Disagree/Comply: Comply

## 2016-06-11 NOTE — Telephone Encounter (Signed)
Patient is currently at ED

## 2016-06-14 ENCOUNTER — Encounter: Payer: Self-pay | Admitting: Surgical

## 2016-06-14 ENCOUNTER — Telehealth: Payer: Self-pay | Admitting: Family Medicine

## 2016-06-14 ENCOUNTER — Ambulatory Visit (INDEPENDENT_AMBULATORY_CARE_PROVIDER_SITE_OTHER): Payer: PRIVATE HEALTH INSURANCE | Admitting: Family Medicine

## 2016-06-14 ENCOUNTER — Encounter: Payer: Self-pay | Admitting: Family Medicine

## 2016-06-14 ENCOUNTER — Ambulatory Visit: Payer: PRIVATE HEALTH INSURANCE | Admitting: Family Medicine

## 2016-06-14 VITALS — BP 116/74 | HR 84 | Temp 98.1°F | Wt 167.0 lb

## 2016-06-14 DIAGNOSIS — R109 Unspecified abdominal pain: Secondary | ICD-10-CM

## 2016-06-14 LAB — URINALYSIS, ROUTINE W REFLEX MICROSCOPIC
Bilirubin Urine: NEGATIVE
Hgb urine dipstick: NEGATIVE
Ketones, ur: NEGATIVE
Leukocytes, UA: NEGATIVE
Nitrite: NEGATIVE
RBC / HPF: NONE SEEN (ref 0–?)
Specific Gravity, Urine: 1.025 (ref 1.000–1.030)
Total Protein, Urine: NEGATIVE
Urine Glucose: 250 — AB
Urobilinogen, UA: 0.2 (ref 0.0–1.0)
pH: 5.5 (ref 5.0–8.0)

## 2016-06-14 LAB — CBC
HCT: 43.5 % (ref 36.0–46.0)
Hemoglobin: 13.8 g/dL (ref 12.0–15.0)
MCHC: 31.8 g/dL (ref 30.0–36.0)
MCV: 78.5 fl (ref 78.0–100.0)
Platelets: 437 10*3/uL — ABNORMAL HIGH (ref 150.0–400.0)
RBC: 5.54 Mil/uL — ABNORMAL HIGH (ref 3.87–5.11)
RDW: 15.1 % (ref 11.5–15.5)
WBC: 12.8 10*3/uL — ABNORMAL HIGH (ref 4.0–10.5)

## 2016-06-14 LAB — COMPREHENSIVE METABOLIC PANEL
ALT: 22 U/L (ref 0–35)
AST: 17 U/L (ref 0–37)
Albumin: 4.4 g/dL (ref 3.5–5.2)
Alkaline Phosphatase: 79 U/L (ref 39–117)
BUN: 9 mg/dL (ref 6–23)
CO2: 27 mEq/L (ref 19–32)
Calcium: 9.7 mg/dL (ref 8.4–10.5)
Chloride: 104 mEq/L (ref 96–112)
Creatinine, Ser: 0.8 mg/dL (ref 0.40–1.20)
GFR: 100.05 mL/min (ref >60.00)
Glucose, Bld: 139 mg/dL — ABNORMAL HIGH (ref 70–99)
Potassium: 3.6 mEq/L (ref 3.5–5.1)
Sodium: 138 mEq/L (ref 135–145)
Total Bilirubin: 0.3 mg/dL (ref 0.2–1.2)
Total Protein: 7.5 g/dL (ref 6.0–8.3)

## 2016-06-14 MED ORDER — METRONIDAZOLE 500 MG PO TABS: 500.0000 mg | ORAL_TABLET | Freq: Three times a day (TID) | ORAL | 0 refills | Status: DC

## 2016-06-14 MED ORDER — CIPROFLOXACIN HCL 500 MG PO TABS: 500.0000 mg | ORAL_TABLET | Freq: Two times a day (BID) | ORAL | 0 refills | Status: DC

## 2016-06-14 MED ORDER — OXYCODONE-ACETAMINOPHEN 5-325 MG PO TABS: 1.0000 | ORAL_TABLET | ORAL | 0 refills | Status: DC | PRN

## 2016-06-14 NOTE — Telephone Encounter (Signed)
Noted  

## 2016-06-14 NOTE — Progress Notes (Signed)
Anna Huffman is a 46 y.o. female here for an acute visit.  History of Present Illness:   Shaune Pascal CMA acting as scribe for Dr. Juleen China.  CC: Patient comes in today for right flank pain that started on Wednesday last week. Pain has radiated down to right groin area. Patient was seen in the emergency room on Friday. CT scan was done to rule out kidney stone, was negative.  Abdominal Pain  This is a new problem. The current episode started in the past 7 days. The onset quality is gradual. The problem occurs constantly. The problem has been gradually worsening. The pain is located in the right flank. The pain is moderate. The quality of the pain is dull and aching. The abdominal pain radiates to the pelvis. Associated symptoms include diarrhea, dysuria, a fever, headaches and nausea. Pertinent negatives include no anorexia, constipation, frequency, hematuria, melena or vomiting. The pain is aggravated by palpation and certain positions. The pain is relieved by nothing. She has tried acetaminophen for the symptoms. The treatment provided no relief. Prior diagnostic workup includes CT scan.    PMHx, SurgHx, SocialHx, Medications, and Allergies were reviewed in the Visit Navigator and updated as appropriate.  Current Medications:   .  fluticasone (FLONASE) 50 MCG/ACT nasal spray, Place 2 sprays into both nostrils daily., Disp: 16 g, Rfl: 3 .  hydrOXYzine (ATARAX/VISTARIL) 25 MG tablet, Take 1 tablet (25 mg total) by mouth every 6 (six) hours as needed for itching (insomnia)., Disp: 20 tablet, Rfl: 0 .  metFORMIN (GLUCOPHAGE-XR) 500 MG 24 hr tablet, Take 500 mg by mouth 2 (two) times daily., Disp: , Rfl: 0 .  methocarbamol (ROBAXIN) 500 MG tablet, Take 1 tablet (500 mg total) by mouth at bedtime as needed for muscle spasms., Disp: 20 tablet, Rfl: 0 .  naproxen (NAPROSYN) 500 MG tablet, Take 1 tablet (500 mg total) by mouth 2 (two) times daily with a meal., Disp: 30 tablet, Rfl: 0   Allergies   Allergen Reactions  . Codeine Itching  . Latex   . Wellbutrin [Bupropion] Itching   Review of Systems:   Review of Systems  Constitutional: Positive for chills, fever and malaise/fatigue.  HENT: Negative for congestion, ear pain, sinus pain and sore throat.   Eyes: Negative for blurred vision and double vision.  Respiratory: Negative for cough, shortness of breath and wheezing.   Cardiovascular: Negative for chest pain, palpitations and leg swelling.  Gastrointestinal: Positive for abdominal pain, diarrhea and nausea. Negative for anorexia, constipation, melena and vomiting.  Genitourinary: Positive for dysuria. Negative for frequency and hematuria.       Pressure with urination.   Musculoskeletal: Positive for back pain.  Neurological: Positive for headaches. Negative for dizziness.  Psychiatric/Behavioral: Negative for depression, hallucinations and memory loss.   Vitals:   Vitals:   06/14/16 1317  BP: 116/74  Pulse: 84  Temp: 98.1 F (36.7 C)  TempSrc: Oral  SpO2: 98%  Weight: 167 lb (75.8 kg)     Body mass index is 31.04 kg/m.  Physical Exam:   General: Cooperative, alert and oriented, well developed, well nourished, in no acute distress. HEENT: Pupils equal round reactive light and extraocular movements intact. Conjunctivae and lids unremarkable, funduscopic exam and visual fields not performed. No pallor or cyanosis, dentition good. Neck: No thyromegaly. No JVD. No carotid bruits.  Cardiovascular: Regular rhythm. S1 normal. S2 normal. No S3 or S4. Apical impulse not displaced. No murmurs. No gallops. No rubs. Lungs: Clear bilaterally  without rales, rhonchi, or wheezing. Normal symmetry, no tenderness to palpation, normal respiratory excursion, no use of accessory muscles. Abdomen: ttp right flank without rebound or guarding.  Extremities: No clubbing, cyanosis, erythema. No edema. Normal muscle strength and tone. Pulses: 2+ radial, 2+ femoral pulses, 2+ pedal  pulses. Skin: Reveals no rashes.  Neurologic: Cranial nerves are intact with no focal deficits.  Psychiatric: Alert and oriented to person place and time.  Urinalysis, Routine w reflex microscopic  Result Value Ref Range   Color, Urine YELLOW Yellow;Lt. Yellow   APPearance CLEAR Clear   Specific Gravity, Urine 1.025 1.000 - 1.030   pH 5.5 5.0 - 8.0   Total Protein, Urine NEGATIVE Negative   Urine Glucose 250 (A) Negative   Ketones, ur NEGATIVE Negative   Bilirubin Urine NEGATIVE Negative   Hgb urine dipstick NEGATIVE Negative   Urobilinogen, UA 0.2 0.0 - 1.0   Leukocytes, UA NEGATIVE Negative   Nitrite NEGATIVE Negative   WBC, UA 0-2/hpf 0-2/hpf   RBC / HPF none seen 0-2/hpf   Mucus, UA Presence of (A) None   Squamous Epithelial / LPF Rare(0-4/hpf) Rare(0-4/hpf)   Bacteria, UA Many(>50/hpf) (A) None   Uric Acid Crys, UA Presence of (A) None  CBC  Result Value Ref Range   WBC 12.8 (H) 4.0 - 10.5 K/uL   RBC 5.54 (H) 3.87 - 5.11 Mil/uL   Platelets 437.0 (H) 150.0 - 400.0 K/uL   Hemoglobin 13.8 12.0 - 15.0 g/dL   HCT 43.5 36.0 - 46.0 %   MCV 78.5 78.0 - 100.0 fl   MCHC 31.8 30.0 - 36.0 g/dL   RDW 15.1 11.5 - 15.5 %  Comprehensive metabolic panel  Result Value Ref Range   Sodium 138 135 - 145 mEq/L   Potassium 3.6 3.5 - 5.1 mEq/L   Chloride 104 96 - 112 mEq/L   CO2 27 19 - 32 mEq/L   Glucose, Bld 139 (H) 70 - 99 mg/dL   BUN 9 6 - 23 mg/dL   Creatinine, Ser 0.80 0.40 - 1.20 mg/dL   Total Bilirubin 0.3 0.2 - 1.2 mg/dL   Alkaline Phosphatase 79 39 - 117 U/L   AST 17 0 - 37 U/L   ALT 22 0 - 35 U/L   Total Protein 7.5 6.0 - 8.3 g/dL   Albumin 4.4 3.5 - 5.2 g/dL   Calcium 9.7 8.4 - 10.5 mg/dL   GFR 100.05 >60.00 mL/min    Assessment and Plan:   Kallan was seen today for follow-up and diarrhea.  Diagnoses and all orders for this visit:  Right flank pain Comments: c/w UTI versus colitis. Coverage for both as below. Red flags reviewed. Orders: -     Urine culture -      Urinalysis, Routine w reflex microscopic -     CBC -     Comprehensive metabolic panel -     ciprofloxacin (CIPRO) 500 MG tablet; Take 1 tablet (500 mg total) by mouth 2 (two) times daily. -     metroNIDAZOLE (FLAGYL) 500 MG tablet; Take 1 tablet (500 mg total) by mouth 3 (three) times daily. -     oxyCODONE-acetaminophen (ROXICET) 5-325 MG tablet; Take 1 tablet by mouth every 4 (four) hours as needed for severe pain.   . Reviewed expectations re: course of current medical issues. . Discussed self-management of symptoms. . Outlined signs and symptoms indicating need for more acute intervention. . Patient verbalized understanding and all questions were answered. Marland Kitchen Health Maintenance  issues including appropriate healthy diet, exercise, and smoking avoidance were discussed with patient. . See orders for this visit as documented in the electronic medical record. . Patient received an After Visit Summary.  CMA served as Education administrator during this visit. History, Physical, and Plan performed by medical provider. The above documentation has been reviewed and is accurate and complete. Briscoe Deutscher, D.O.  Briscoe Deutscher, DO [place] 06/19/2016  Future Appointments Date Time Provider [place]  07/12/2016 11:30 AM Briscoe Deutscher, DO [facility] None

## 2016-06-14 NOTE — Telephone Encounter (Signed)
Patient scheduled for 10:45 after speaking with jamie  See ED notes from 06/11/2016. Patient states her kidney pain is the same and has moved towards the groin

## 2016-06-16 LAB — URINE CULTURE

## 2016-06-17 ENCOUNTER — Telehealth: Payer: Self-pay | Admitting: Family Medicine

## 2016-06-17 NOTE — Telephone Encounter (Signed)
Patient attempting to return phone call.

## 2016-06-18 NOTE — Telephone Encounter (Signed)
Spoke with patient.  She is doing much better.  She was not able to tolerate pain medication but is taking antibiotics.  States her pain is almost gone.  States she has just a dull ache now.  Advised patient to continue to take antibiotics until they are completely gone.  Patient verbalized understanding and was very thankful for the call.

## 2016-07-12 ENCOUNTER — Ambulatory Visit: Payer: PRIVATE HEALTH INSURANCE | Admitting: Family Medicine

## 2016-07-12 DIAGNOSIS — Z0289 Encounter for other administrative examinations: Secondary | ICD-10-CM

## 2017-01-03 ENCOUNTER — Ambulatory Visit: Payer: PRIVATE HEALTH INSURANCE | Admitting: Family Medicine

## 2017-02-27 NOTE — Progress Notes (Signed)
Anna Huffman is a 47 y.o. female is here for follow up.  History of Present Illness:   HPI: See Assessment and Plan section for Problem Based Charting of issues discussed today.   Health Maintenance Due  Topic Date Due  . PNEUMOCOCCAL POLYSACCHARIDE VACCINE (1) 02/03/1974  . OPHTHALMOLOGY EXAM  02/03/1982   Depression screen PHQ 2/9 02/28/2017  Decreased Interest 0  Down, Depressed, Hopeless 0  PHQ - 2 Score 0     PMHx, SurgHx, SocialHx, FamHx, Medications, and Allergies were reviewed in the Visit Navigator and updated as appropriate.   Patient Active Problem List   Diagnosis Date Noted  . Type 2 diabetes mellitus without complication, without long-term current use of insulin ([place]) 04/23/2016  . Seasonal allergies 04/23/2016  . Class 1 obesity due to excess calories without serious comorbidity with body mass index (BMI) of 30.0 to 30.9 in adult 03/27/2016  . Smoker 03/27/2016  . Anxiety   . Fibroid, uterine 07/23/2013   Social History   Tobacco Use  . Smoking status: Current Every Day Smoker    Packs/day: 0.50    Types: Cigarettes  . Smokeless tobacco: Never Used  Substance Use Topics  . Alcohol use: Yes    Comment: social   . Drug use: Yes    Types: Marijuana   Current Medications and Allergies:   Current Outpatient Medications:  .  fluticasone (FLONASE) 50 MCG/ACT nasal spray, Place 2 sprays into both nostrils daily., Disp: 16 g, Rfl: 3 .  Dulaglutide (TRULICITY) 1.44 RX/5.4MG SOPN, 0.75 mg [place] q wk x 2 wks , then increase to 1.5 mg [place] if tolerating, Disp: 0.5 mL, Rfl: 4 .  topiramate (TOPAMAX) 25 MG tablet, Take 1 tablet (25 mg total) by mouth 2 (two) times daily., Disp: 60 tablet, Rfl: 1   Allergies  Allergen Reactions  . Codeine Itching  . Latex   . Wellbutrin [Bupropion] Itching   Review of Systems   Pertinent items are noted in the HPI. Otherwise, ROS is negative.  Vitals:   Vitals:   02/28/17 0855  BP: 122/78  Pulse: 95  Temp: 98.3 F (36.8  C)  TempSrc: Oral  SpO2: 96%  Weight: 166 lb 3.2 oz (75.4 kg)  Height: 5\' 1"  (1.549 m)     Body mass index is 31.4 kg/m.   Physical Exam:   Physical Exam  Constitutional: She appears well-nourished.  HENT:  Head: Normocephalic and atraumatic.  Eyes: EOM are normal. Pupils are equal, round, and reactive to light.  Neck: Normal range of motion. Neck supple.  Cardiovascular: Normal rate, regular rhythm, normal heart sounds and intact distal pulses.  Pulmonary/Chest: Effort normal.  Abdominal: Soft.  Skin: Skin is warm.  Psychiatric: She has a normal mood and affect. Her behavior is normal.  Nursing note and vitals reviewed.   Results for orders placed or performed in visit on 02/28/17  CBC with Differential/Platelet  Result Value Ref Range   WBC 13.9 (H) 4.0 - 10.5 K/uL   RBC 5.48 (H) 3.87 - 5.11 Mil/uL   Hemoglobin 13.9 12.0 - 15.0 g/dL   HCT 43.3 36.0 - 46.0 %   MCV 79.1 78.0 - 100.0 fl   MCHC 32.0 30.0 - 36.0 g/dL   RDW 14.8 11.5 - 15.5 %   Platelets 413.0 (H) 150.0 - 400.0 K/uL   Neutrophils Relative % 57.3 43.0 - 77.0 %   Lymphocytes Relative 35.2 12.0 - 46.0 %   Monocytes Relative 4.7 3.0 - 12.0 %  Eosinophils Relative 2.1 0.0 - 5.0 %   Basophils Relative 0.7 0.0 - 3.0 %   Neutro Abs 8.0 (H) 1.4 - 7.7 K/uL   Lymphs Abs 4.9 (H) 0.7 - 4.0 K/uL   Monocytes Absolute 0.7 0.1 - 1.0 K/uL   Eosinophils Absolute 0.3 0.0 - 0.7 K/uL   Basophils Absolute 0.1 0.0 - 0.1 K/uL  Comprehensive metabolic panel  Result Value Ref Range   Sodium 140 135 - 145 mEq/L   Potassium 4.5 3.5 - 5.1 mEq/L   Chloride 103 96 - 112 mEq/L   CO2 30 19 - 32 mEq/L   Glucose, Bld 110 (H) 70 - 99 mg/dL   BUN 9 6 - 23 mg/dL   Creatinine, Ser 0.76 0.40 - 1.20 mg/dL   Total Bilirubin 0.4 0.2 - 1.2 mg/dL   Alkaline Phosphatase 82 39 - 117 U/L   AST 13 0 - 37 U/L   ALT 17 0 - 35 U/L   Total Protein 7.2 6.0 - 8.3 g/dL   Albumin 4.3 3.5 - 5.2 g/dL   Calcium 9.7 8.4 - 10.5 mg/dL   GFR 105.81 >60.00  mL/min  Lipid panel  Result Value Ref Range   Cholesterol 135 0 - 200 mg/dL   Triglycerides 96.0 0.0 - 149.0 mg/dL   HDL 50.70 >39.00 mg/dL   VLDL 19.2 0.0 - 40.0 mg/dL   LDL Cholesterol 65 0 - 99 mg/dL   Total CHOL/HDL Ratio 3    NonHDL 84.39   TSH  Result Value Ref Range   TSH 0.80 0.35 - 4.50 uIU/mL  Vitamin B12  Result Value Ref Range   Vitamin B-12 151 (L) 211 - 911 pg/mL  VITAMIN D 25 Hydroxy (Vit-D Deficiency, Fractures)  Result Value Ref Range   VITD 8.40 (L) 30.00 - 100.00 ng/mL  POCT glycosylated hemoglobin (Hb A1C)  Result Value Ref Range   Hemoglobin A1C 6.1      Assessment and Plan:   1. Type 2 diabetes mellitus without complication, without long-term current use of insulin (HCC) Current symptoms: no polyuria or polydipsia, has dysesthesias in the feet.  Maintaining a diabetic diet? []   YES  [x]   NO Trying to exercise on a regular basis? []   YES  [x]   NO  On ACE inhibitor or angiotensin II receptor blocker? []   YES  [x]   NO  On Aspirin? []   YES  [x]   NO  Lab Results  Component Value Date   HGBA1C 6.1 02/28/2017    Lab Results  Component Value Date   MICROALBUR <0.7 04/09/2016    Lab Results  Component Value Date   CHOL 135 02/28/2017   HDL 50.70 02/28/2017   LDLCALC 65 02/28/2017   TRIG 96.0 02/28/2017   CHOLHDL 3 02/28/2017     Wt Readings from Last 3 Encounters:  02/28/17 166 lb 3.2 oz (75.4 kg)  06/14/16 167 lb (75.8 kg)  04/21/16 169 lb 3.2 oz (76.7 kg)   BP Readings from Last 3 Encounters:  02/28/17 122/78  06/14/16 116/74  06/11/16 123/87   Lab Results  Component Value Date   CREATININE 0.76 02/28/2017   - POCT glycosylated hemoglobin (Hb A1C) - Dulaglutide (TRULICITY) 4.81 EH/6.3JS SOPN; 0.75 mg [place] q wk x 2 wks , then increase to 1.5 mg  if tolerating  Dispense: 0.5 mL; Refill: 4  2. Exertional chest pain The patient had a few episodes of anterior chest pain, described as sharp, without SOB or other symptoms, lasted seconds to  minutes until she stopped exertion. EKG: normal EKG, normal sinus rhythm.   The 10-year ASCVD risk score Mikey Bussing DC Brooke Bonito., et al., 2013) is: 3.3%   Values used to calculate the score:     Age: 71 years     Sex: Female     Is Non-Hispanic African American: Yes     Diabetic: Yes     Tobacco smoker: Yes     Systolic Blood Pressure: 563 mmHg     Is BP treated: No     HDL Cholesterol: 50.7 mg/dL     Total Cholesterol: 135 mg/dL  Risk low, but with good story and comorbid conditions. To Cardiology for consideration of stress testing. Reviewed with patient.   - CBC with Differential/Platelet - Comprehensive metabolic panel - EKG 89-HTDS - Referral to Cardiology   3. Pure hypercholesterolemia  Lab Results  Component Value Date   CHOL 135 02/28/2017   HDL 50.70 02/28/2017   LDLCALC 65 02/28/2017   TRIG 96.0 02/28/2017   CHOLHDL 3 02/28/2017   - Lipid panel  4. Obesity (BMI 30-39.9) The patient is asked to make an attempt to improve diet and exercise patterns to aid in medical management of this problem.   5. Paresthesia - TSH - Vitamin B12  6. Vitamin D deficiency Anna Huffman was informed that low vitamin D levels contributes to fatigue and are associated with obesity, breast, and colon cancer. Current vitamin D level is 8.40. Goal vitamin D level > 40. She will supplement as below and recheck in 3-6 months.  [x]   Vitamin D 50,000 IU x 12 weeks, then 2000 IU daily.  - VITAMIN D 25 Hydroxy (Vit-D Deficiency, Fractures)  7. Migraine with aura and without status migrainosus, not intractable After discussion, patient would like to start below medication. Expectations, risks, and potential side effects reviewed.   - topiramate (TOPAMAX) 25 MG tablet; Take 1 tablet (25 mg total) by mouth 2 (two) times daily.  Dispense: 60 tablet; Refill: 1  8. Need for prophylactic vaccination against Streptococcus pneumoniae (pneumococcus) - Pneumococcal polysaccharide vaccine 23-valent greater than or  equal to 2yo subcutaneous/IM  9. B12 deficiency Low. Will offer injections.  10. Leukocytosis Will monitor.   . Reviewed expectations re: course of current medical issues. . Discussed self-management of symptoms. . Outlined signs and symptoms indicating need for more acute intervention. . Patient verbalized understanding and all questions were answered. Marland Kitchen Health Maintenance issues including appropriate healthy diet, exercise, and smoking avoidance were discussed with patient. . See orders for this visit as documented in the electronic medical record. . Patient received an After Visit Summary.  Briscoe Deutscher, DO [place], [facility] 03/03/2017

## 2017-02-28 ENCOUNTER — Ambulatory Visit (INDEPENDENT_AMBULATORY_CARE_PROVIDER_SITE_OTHER): Payer: PRIVATE HEALTH INSURANCE | Admitting: Family Medicine

## 2017-02-28 ENCOUNTER — Encounter: Payer: Self-pay | Admitting: Family Medicine

## 2017-02-28 VITALS — BP 122/78 | HR 95 | Temp 98.3°F | Ht 61.0 in | Wt 166.2 lb

## 2017-02-28 DIAGNOSIS — E538 Deficiency of other specified B group vitamins: Secondary | ICD-10-CM | POA: Diagnosis not present

## 2017-02-28 DIAGNOSIS — R079 Chest pain, unspecified: Secondary | ICD-10-CM

## 2017-02-28 DIAGNOSIS — D72829 Elevated white blood cell count, unspecified: Secondary | ICD-10-CM

## 2017-02-28 DIAGNOSIS — G43109 Migraine with aura, not intractable, without status migrainosus: Secondary | ICD-10-CM | POA: Diagnosis not present

## 2017-02-28 DIAGNOSIS — Z23 Encounter for immunization: Secondary | ICD-10-CM

## 2017-02-28 DIAGNOSIS — E669 Obesity, unspecified: Secondary | ICD-10-CM

## 2017-02-28 DIAGNOSIS — E119 Type 2 diabetes mellitus without complications: Secondary | ICD-10-CM

## 2017-02-28 DIAGNOSIS — E7800 Pure hypercholesterolemia, unspecified: Secondary | ICD-10-CM | POA: Diagnosis not present

## 2017-02-28 DIAGNOSIS — R202 Paresthesia of skin: Secondary | ICD-10-CM | POA: Diagnosis not present

## 2017-02-28 DIAGNOSIS — E559 Vitamin D deficiency, unspecified: Secondary | ICD-10-CM | POA: Diagnosis not present

## 2017-02-28 LAB — COMPREHENSIVE METABOLIC PANEL
ALT: 17 U/L (ref 0–35)
AST: 13 U/L (ref 0–37)
Albumin: 4.3 g/dL (ref 3.5–5.2)
Alkaline Phosphatase: 82 U/L (ref 39–117)
BUN: 9 mg/dL (ref 6–23)
CO2: 30 mEq/L (ref 19–32)
Calcium: 9.7 mg/dL (ref 8.4–10.5)
Chloride: 103 mEq/L (ref 96–112)
Creatinine, Ser: 0.76 mg/dL (ref 0.40–1.20)
GFR: 105.81 mL/min (ref >60.00)
Glucose, Bld: 110 mg/dL — ABNORMAL HIGH (ref 70–99)
Potassium: 4.5 mEq/L (ref 3.5–5.1)
Sodium: 140 mEq/L (ref 135–145)
Total Bilirubin: 0.4 mg/dL (ref 0.2–1.2)
Total Protein: 7.2 g/dL (ref 6.0–8.3)

## 2017-02-28 LAB — LIPID PANEL
Cholesterol: 135 mg/dL (ref 0–200)
HDL: 50.7 mg/dL (ref >39.00)
LDL Cholesterol: 65 mg/dL (ref 0–99)
NonHDL: 84.39
Total CHOL/HDL Ratio: 3
Triglycerides: 96 mg/dL (ref 0.0–149.0)
VLDL: 19.2 mg/dL (ref 0.0–40.0)

## 2017-02-28 LAB — CBC WITH DIFFERENTIAL/PLATELET
Basophils Absolute: 0.1 10*3/uL (ref 0.0–0.1)
Basophils Relative: 0.7 % (ref 0.0–3.0)
Eosinophils Absolute: 0.3 10*3/uL (ref 0.0–0.7)
Eosinophils Relative: 2.1 % (ref 0.0–5.0)
HCT: 43.3 % (ref 36.0–46.0)
Hemoglobin: 13.9 g/dL (ref 12.0–15.0)
Lymphocytes Relative: 35.2 % (ref 12.0–46.0)
Lymphs Abs: 4.9 10*3/uL — ABNORMAL HIGH (ref 0.7–4.0)
MCHC: 32 g/dL (ref 30.0–36.0)
MCV: 79.1 fl (ref 78.0–100.0)
Monocytes Absolute: 0.7 10*3/uL (ref 0.1–1.0)
Monocytes Relative: 4.7 % (ref 3.0–12.0)
Neutro Abs: 8 10*3/uL — ABNORMAL HIGH (ref 1.4–7.7)
Neutrophils Relative %: 57.3 % (ref 43.0–77.0)
Platelets: 413 10*3/uL — ABNORMAL HIGH (ref 150.0–400.0)
RBC: 5.48 Mil/uL — ABNORMAL HIGH (ref 3.87–5.11)
RDW: 14.8 % (ref 11.5–15.5)
WBC: 13.9 10*3/uL — ABNORMAL HIGH (ref 4.0–10.5)

## 2017-02-28 LAB — TSH: TSH: 0.8 u[IU]/mL (ref 0.35–4.50)

## 2017-02-28 LAB — VITAMIN B12: Vitamin B-12: 151 pg/mL — ABNORMAL LOW (ref 211–911)

## 2017-02-28 LAB — VITAMIN D 25 HYDROXY (VIT D DEFICIENCY, FRACTURES): VITD: 8.4 ng/mL — ABNORMAL LOW (ref 30.00–100.00)

## 2017-02-28 LAB — POCT GLYCOSYLATED HEMOGLOBIN (HGB A1C): Hemoglobin A1C: 6.1

## 2017-02-28 MED ORDER — DULAGLUTIDE 0.75 MG/0.5ML ~~LOC~~ SOAJ: SUBCUTANEOUS | 4 refills | Status: DC

## 2017-02-28 MED ORDER — TOPIRAMATE 25 MG PO TABS: 25.0000 mg | ORAL_TABLET | Freq: Two times a day (BID) | ORAL | 1 refills | Status: DC

## 2017-03-03 ENCOUNTER — Encounter: Payer: Self-pay | Admitting: Family Medicine

## 2017-03-03 MED ORDER — CHOLECALCIFEROL 1.25 MG (50000 UT) PO TABS: ORAL_TABLET | ORAL | 0 refills | Status: DC

## 2017-03-28 ENCOUNTER — Other Ambulatory Visit: Payer: Self-pay | Admitting: Family Medicine

## 2017-03-28 DIAGNOSIS — E119 Type 2 diabetes mellitus without complications: Secondary | ICD-10-CM

## 2017-03-28 NOTE — Progress Notes (Deleted)
Cardiology Office Note   Date:  03/28/2017   ID:  CLOMA RAHRIG, DOB Jul 06, 1970, MRN [id number]  PCP:  Briscoe Deutscher, DO  Cardiologist:   Jenkins Rouge, MD   No chief complaint on file.     History of Present Illness: Anna Huffman is a 47 y.o. female who presents for consultation regarding chest pain Referred by Briscoe Deutscher DO.  She smokes cigarettes , mariajuana and has DM Reviewed Her office note from 02/28/17 atypical sharp right anterior chest pain. Lasts seconds to minutes Sometimes related to exertion but not always ECG in office was normal. Referred for possible Stress testing Pains occurring during last couple of months. No documents vascular / CAD in past.   ***    Past Medical History:  Diagnosis Date  . Anemia   . Anxiety   . Blood transfusion without reported diagnosis   . Heart murmur     Past Surgical History:  Procedure Laterality Date  . BREAST REDUCTION SURGERY       Current Outpatient Medications  Medication Sig Dispense Refill  . Cholecalciferol 50000 units TABS 50,000 units PO qwk for 12 weeks. 12 tablet 0  . Dulaglutide (TRULICITY) 8.46 NG/2.9BM SOPN 0.75 mg [place] q wk x 2 wks , then increase to 1.5 mg [place] if tolerating 0.5 mL 4  . fluticasone (FLONASE) 50 MCG/ACT nasal spray Place 2 sprays into both nostrils daily. 16 g 3  . topiramate (TOPAMAX) 25 MG tablet Take 1 tablet (25 mg total) by mouth 2 (two) times daily. 60 tablet 1   No current facility-administered medications for this visit.     Allergies:   Codeine; Latex; and Wellbutrin [bupropion]    Social History:  The patient  reports that she has been smoking cigarettes.  She has been smoking about 0.50 packs per day. she has never used smokeless tobacco. She reports that she drinks alcohol. She reports that she uses drugs. Drug: Marijuana.   Family History:  The patient's family history is not on file.    ROS:  Please see the history of present illness.   Otherwise, review of  systems are positive for {NONE DEFAULTED:18576::"none"}.   All other systems are reviewed and negative.    PHYSICAL EXAM: VS:  There were no vitals taken for this visit. , BMI There is no height or weight on file to calculate BMI. Affect appropriate Healthy:  appears stated age 18: normal Neck supple with no adenopathy JVP normal no bruits no thyromegaly Lungs clear with no wheezing and good diaphragmatic motion Heart:  S1/S2 no murmur, no rub, gallop or click PMI normal Abdomen: benighn, BS positve, no tenderness, no AAA no bruit.  No HSM or HJR Distal pulses intact with no bruits No edema Neuro non-focal Skin warm and dry No muscular weakness    EKG:  NSR normal ECG 02/28/17   Recent Labs: 02/28/2017: ALT 17; BUN 9; Creatinine, Ser 0.76; Hemoglobin 13.9; Platelets 413.0; Potassium 4.5; Sodium 140; TSH 0.80    Lipid Panel    Component Value Date/Time   CHOL 135 02/28/2017 0917   TRIG 96.0 02/28/2017 0917   HDL 50.70 02/28/2017 0917   CHOLHDL 3 02/28/2017 0917   VLDL 19.2 02/28/2017 0917   LDLCALC 65 02/28/2017 0917      Wt Readings from Last 3 Encounters:  02/28/17 166 lb 3.2 oz (75.4 kg)  06/14/16 167 lb (75.8 kg)  04/21/16 169 lb 3.2 oz (76.7 kg)  Other studies Reviewed: Additional studies/ records that were reviewed today include. Primary notes 02/28/17 labs and eCG     ASSESSMENT AND PLAN:  1.  Chest pain: atypical normal ECG Smoker with DM f/u ETT 2. DM:  Discussed low carb diet.  Target hemoglobin A1c is 6.5 or less.  Continue current medications. 3. Anxiety:  ***   Current medicines are reviewed at length with the patient today.  The patient {ACTIONS; HAS/DOES NOT HAVE:19233} concerns regarding medicines.  The following changes have been made:  {PLAN; NO CHANGE:13088:s}  Labs/ tests ordered today include: ETT  No orders of the defined types were placed in this encounter.    Disposition:   FU with  Cardiology PRN     Signed, Jenkins Rouge, MD  03/28/2017 2:05 PM    [facility] [address], [place], [place]  [address] Phone: [phone]; Fax: [phone]

## 2017-03-28 NOTE — Telephone Encounter (Signed)
Pt also asking for someone to contact her about the b12 shots that she is also to administer herself, pt states she is unaware of where to get the b12, contact pt to advise

## 2017-03-28 NOTE — Telephone Encounter (Signed)
Copied from [place] [id number]. Topic: Quick Communication - Rx Refill/Question >> Mar 28, 2017  4:34 PM Oliver Pila B wrote: Medication: Dulaglutide (TRULICITY) 4.85 IO/2.7OJ SOPN [[id number]]     Has the patient contacted their pharmacy? Yes.     (Agent: If no, request that the patient contact the pharmacy for the refill.)   Preferred Pharmacy (with phone number or street name): [facility]   Agent: Please be advised that RX refills may take up to 3 business days. We ask that you follow-up with your pharmacy.

## 2017-03-29 MED ORDER — DULAGLUTIDE 0.75 MG/0.5ML ~~LOC~~ SOAJ: SUBCUTANEOUS | 4 refills | Status: DC

## 2017-03-29 NOTE — Telephone Encounter (Signed)
See note

## 2017-03-30 ENCOUNTER — Ambulatory Visit: Payer: PRIVATE HEALTH INSURANCE | Admitting: Cardiovascular Disease

## 2017-03-30 MED ORDER — "SYRINGE 25G X 1"" 3 ML MISC": 0 refills | Status: DC

## 2017-03-30 MED ORDER — CYANOCOBALAMIN 1000 MCG/ML IJ SOLN: INTRAMUSCULAR | 15 refills | Status: DC

## 2017-03-30 NOTE — Addendum Note (Signed)
Addended by: Durwin Glaze on: 03/30/2017 07:34 AM   Modules accepted: Orders

## 2017-03-30 NOTE — Telephone Encounter (Signed)
Called patient and l/m to let her know meds have been called in.

## 2017-03-30 NOTE — Telephone Encounter (Signed)
I have sent in B12. I will call the patient to inform.

## 2017-04-15 ENCOUNTER — Telehealth: Payer: Self-pay

## 2017-04-15 NOTE — Telephone Encounter (Signed)
Patient is on the list for Statin therapy. Last app was in February. She does not have one do you want me to call for f/u ?

## 2017-04-15 NOTE — Telephone Encounter (Signed)
Called pt she declined at this time. Documented in chart.

## 2017-04-15 NOTE — Telephone Encounter (Signed)
Call and ask if she would be okay with low dose statin due to guidelines.

## 2017-05-11 ENCOUNTER — Ambulatory Visit: Payer: PRIVATE HEALTH INSURANCE | Admitting: Cardiology

## 2017-05-12 ENCOUNTER — Encounter: Payer: Self-pay | Admitting: *Deleted

## 2017-07-17 ENCOUNTER — Other Ambulatory Visit: Payer: Self-pay | Admitting: Family Medicine

## 2017-07-17 DIAGNOSIS — E119 Type 2 diabetes mellitus without complications: Secondary | ICD-10-CM

## 2017-08-12 ENCOUNTER — Other Ambulatory Visit: Payer: Self-pay | Admitting: Family Medicine

## 2017-08-12 DIAGNOSIS — E119 Type 2 diabetes mellitus without complications: Secondary | ICD-10-CM

## 2017-08-16 NOTE — Telephone Encounter (Signed)
Please advise on refill. Do you want the patient to increase does.

## 2017-09-13 ENCOUNTER — Ambulatory Visit (INDEPENDENT_AMBULATORY_CARE_PROVIDER_SITE_OTHER): Payer: PRIVATE HEALTH INSURANCE | Admitting: Physician Assistant

## 2017-09-13 ENCOUNTER — Encounter: Payer: PRIVATE HEALTH INSURANCE | Admitting: Family Medicine

## 2017-09-13 ENCOUNTER — Encounter: Payer: Self-pay | Admitting: Physician Assistant

## 2017-09-13 VITALS — BP 128/80 | HR 88 | Temp 98.3°F | Ht 61.0 in | Wt 153.4 lb

## 2017-09-13 DIAGNOSIS — E538 Deficiency of other specified B group vitamins: Secondary | ICD-10-CM | POA: Diagnosis not present

## 2017-09-13 DIAGNOSIS — E559 Vitamin D deficiency, unspecified: Secondary | ICD-10-CM

## 2017-09-13 DIAGNOSIS — F419 Anxiety disorder, unspecified: Secondary | ICD-10-CM

## 2017-09-13 DIAGNOSIS — F17200 Nicotine dependence, unspecified, uncomplicated: Secondary | ICD-10-CM

## 2017-09-13 DIAGNOSIS — E119 Type 2 diabetes mellitus without complications: Secondary | ICD-10-CM | POA: Diagnosis not present

## 2017-09-13 DIAGNOSIS — Z0000 Encounter for general adult medical examination without abnormal findings: Secondary | ICD-10-CM

## 2017-09-13 DIAGNOSIS — G43909 Migraine, unspecified, not intractable, without status migrainosus: Secondary | ICD-10-CM

## 2017-09-13 DIAGNOSIS — D72829 Elevated white blood cell count, unspecified: Secondary | ICD-10-CM | POA: Diagnosis not present

## 2017-09-13 NOTE — Progress Notes (Signed)
Subjective:    Anna Huffman is a 47 y.o. female and is here for a comprehensive physical exam.   HPI     Health Maintenance Due  Topic Date Due  . OPHTHALMOLOGY EXAM  02/03/1982  . URINE MICROALBUMIN  04/09/2017  . HEMOGLOBIN A1C  08/28/2017    Acute Concerns: None  Chronic Issues: Type 2 diabetes -- does not check blood sugars regularly, currently on 0.75mg  pen Trulicity, rash on Metformin. Has only missed one week of Trulicity while on it. Had two episodes where she felt like she was "hypoglycemic", having episodes of feeling shaky and lightheaded, that was relieved with eating. HLD -- currently not on cholesterol medication, feels like she doesn't need to be on more medications if she doesn't have to Overweight -- eating better, more controlled CHO intake Smoker -- will smoke up to 1 PPD, at age 53  Vitamin B12 deficiency -- did 3 weeks worth of injections on her own, then stopped Vitamin D deficiency -- did the high dose Vit D supplements x 12 weeks, now on oral 500 mg daily Anxiety -- was on Wellbutrin but feels like her anxiety is much better now Migraine -- topamax, tried 3 times. Didn't help at all when she used it.  Leukocytosis -- WBC in the past have been 13.9, 12.8, 14.5 -- will re-check with labs today  Health Maintenance: Immunizations -- UTD Colonoscopy -- will start at age 16 Mammogram -- overdue PAP -- 03/26/16 --> normal, repeat in 3 years from last PAP Sleep habits -- sleep is adequate Exercise -- walks a lot Weight -- Weight: 153 lb 6.4 oz (69.6 kg)  Mood -- good Weight history: Wt Readings from Last 10 Encounters:  09/13/17 153 lb 6.4 oz (69.6 kg)  02/28/17 166 lb 3.2 oz (75.4 kg)  06/14/16 167 lb (75.8 kg)  04/21/16 169 lb 3.2 oz (76.7 kg)  04/09/16 167 lb 9.6 oz (76 kg)  03/26/16 168 lb 6.4 oz (76.4 kg)  03/17/15 171 lb (77.6 kg)  09/16/14 170 lb (77.1 kg)   No LMP recorded. Patient is postmenopausal.   Depression screen PHQ 2/9  02/28/2017  Decreased Interest 0  Down, Depressed, Hopeless 0  PHQ - 2 Score 0       PMHx, SurgHx, SocialHx, Medications, and Allergies were reviewed in the Visit Navigator and updated as appropriate.   Past Medical History:  Diagnosis Date  . Anemia   . Anxiety   . Blood transfusion without reported diagnosis   . Heart murmur      Past Surgical History:  Procedure Laterality Date  . BREAST REDUCTION SURGERY       Family History  Problem Relation Age of Onset  . Stomach cancer Mother   . Prostate cancer Father     Social History   Tobacco Use  . Smoking status: Current Every Day Smoker    Packs/day: 0.50    Types: Cigarettes  . Smokeless tobacco: Never Used  Substance Use Topics  . Alcohol use: Yes    Comment: social   . Drug use: Yes    Types: Marijuana    Review of Systems:   ROS  Objective:   BP 128/80 (BP Location: Right Arm, Patient Position: Sitting, Cuff Size: Normal)   Pulse 88   Temp 98.3 F (36.8 C) (Oral)   Ht 5\' 1"  (1.549 m)   Wt 153 lb 6.4 oz (69.6 kg)   SpO2 97%   BMI 28.98 kg/m  Body mass index is 28.98 kg/m.   General Appearance:    Alert, cooperative, no distress, appears stated age  Head:    Normocephalic, without obvious abnormality, atraumatic  Eyes:    PERRL, conjunctiva/corneas clear, EOM's intact, fundi    benign, both eyes  Ears:    Normal TM's and external ear canals, both ears  Nose:   Nares normal, septum midline, mucosa normal, no drainage    or sinus tenderness  Throat:   Lips, mucosa, and tongue normal; teeth and gums normal  Neck:   Supple, symmetrical, trachea midline, no adenopathy;    thyroid:  no enlargement/tenderness/nodules; no carotid   bruit or JVD  Back:     Symmetric, no curvature, ROM normal, no CVA tenderness  Lungs:     Clear to auscultation bilaterally, respirations unlabored  Chest Wall:    No tenderness or deformity   Heart:    Regular rate and rhythm, S1 and S2 normal, no murmur, rub   or  gallop  Breast Exam:    Deferred  Abdomen:     Soft, non-tender, bowel sounds active all four quadrants,    no masses, no organomegaly  Genitalia:    Deferred  Extremities:   Extremities normal, atraumatic, no cyanosis or edema  Pulses:   2+ and symmetric all extremities  Skin:   Skin color, texture, turgor normal, no rashes or lesions  Lymph nodes:   Cervical, supraclavicular, and axillary nodes normal  Neurologic:   CNII-XII intact, normal strength, sensation and reflexes    throughout    Assessment/Plan:   Shalie was seen today for annual exam.  Diagnoses and all orders for this visit:  Routine physical examination Today patient counseled on age appropriate routine health concerns for screening and prevention, each reviewed and up to date or declined. Immunizations reviewed and up to date or declined. Labs ordered and reviewed. Risk factors for depression reviewed and negative. Hearing function and visual acuity are intact. ADLs screened and addressed as needed. Functional ability and level of safety reviewed and appropriate. Education, counseling and referrals performed based on assessed risks today. Patient provided with a copy of personalized plan for preventive services.  Type 2 diabetes mellitus without complication, without long-term current use of insulin (HCC) Hold Trulicity x 1 month. Follow-up with Dr. Juleen China in 1 month, sooner if concerns. Encouraged eye exam. ASCVD is 4.1%, she has declined statins for CV protection given her DM. -     Microalbumin / creatinine urine ratio -     Hemoglobin A1c  Vitamin D deficiency Continue supplement. -     VITAMIN D 25 Hydroxy (Vit-D Deficiency, Fractures)  B12 deficiency Recommended resuming vit B12 injections. She may do at home or we can do here, patient to inform us what she would like to do. -     Vitamin B12  Leukocytosis, unspecified type Slightly improved. Defer to PCP for further work-up. -     CBC with  Differential/Platelet -     Comprehensive metabolic panel  Smoker Encouraged cessation. -     CBC with Differential/Platelet -     Comprehensive metabolic panel  Anxiety Currently well controlled. Follow-up if symptoms change.  Migraine May increase Topamax to 50 mg BID to see if this helpful.    Well Adult Exam: Labs ordered: Yes. Patient counseling was done. See below for items discussed. Discussed the patient's BMI.  The BMI BMI is not in the acceptable range; BMI management plan is completed Follow up  in 1 month. Breast cancer screening: overdue. Cervical cancer screening: UTD   Patient Counseling: [x]    Nutrition: Stressed importance of moderation in sodium/caffeine intake, saturated fat and cholesterol, caloric balance, sufficient intake of fresh fruits, vegetables, fiber, calcium, iron, and 1 mg of folate supplement per day (for females capable of pregnancy).  [x]    Stressed the importance of regular exercise.   [x]    Substance Abuse: Discussed cessation/primary prevention of tobacco, alcohol, or other drug use; driving or other dangerous activities under the influence; availability of treatment for abuse.   [x]    Injury prevention: Discussed safety belts, safety helmets, smoke detector, smoking near bedding or upholstery.   [x]    Sexuality: Discussed sexually transmitted diseases, partner selection, use of condoms, avoidance of unintended pregnancy  and contraceptive alternatives.  [x]    Dental health: Discussed importance of regular tooth brushing, flossing, and dental visits.  [x]    Health maintenance and immunizations reviewed. Please refer to Health maintenance section.    Inda Coke, PA-C [place]

## 2017-09-13 NOTE — Progress Notes (Deleted)
Subjective:    Anna Huffman is a 47 y.o. female and is here for a comprehensive physical exam.  Health Maintenance Due  Topic Date Due  . OPHTHALMOLOGY EXAM  02/03/1982  . URINE MICROALBUMIN  04/09/2017  . HEMOGLOBIN A1C  08/28/2017     Current Outpatient Medications:  .  Cholecalciferol 50000 units TABS, 50,000 units PO qwk for 12 weeks., Disp: 12 tablet, Rfl: 0 .  cyanocobalamin (,VITAMIN B-12,) 1000 MCG/ML injection, 1000 mcg (1 mg) injection once per week for four weeks, followed by 1000 mcg injection once per month., Disp: 1 mL, Rfl: 15 .  fluticasone (FLONASE) 50 MCG/ACT nasal spray, Place 2 sprays into both nostrils daily., Disp: 16 g, Rfl: 3 .  Syringe/Needle, Disp, (SYRINGE 3CC/25GX1") 25G X 1" 3 ML MISC, Use to give B 12 injection, Disp: 50 each, Rfl: 0 .  topiramate (TOPAMAX) 25 MG tablet, Take 1 tablet (25 mg total) by mouth 2 (two) times daily., Disp: 60 tablet, Rfl: 1 .  TRULICITY 8.34 HD/6.2IW SOPN, INJECT 0.75 MILLIGRAM SUBCUTANEOUSLY EVERY WEEK FOR 2 WEEKS THEN INCREASE TO 1.5 MILLIGRAM SUBCUTANEOUSLY IF TOLERATED, Disp: 2 mL, Rfl: 0  PMHx, SurgHx, SocialHx, Medications, and Allergies were reviewed in the Visit Navigator and updated as appropriate.   Past Medical History:  Diagnosis Date  . Anemia   . Anxiety   . Blood transfusion without reported diagnosis   . Heart murmur     Past Surgical History:  Procedure Laterality Date  . BREAST REDUCTION SURGERY      No family history on file.  Social History   Tobacco Use  . Smoking status: Current Every Day Smoker    Packs/day: 0.50    Types: Cigarettes  . Smokeless tobacco: Never Used  Substance Use Topics  . Alcohol use: Yes    Comment: social   . Drug use: Yes    Types: Marijuana    Review of Systems:   Pertinent items are noted in the HPI. Otherwise, ROS is negative.  Objective:   There were no vitals taken for this visit.  General appearance: alert, cooperative and appears stated  age. Head: normocephalic, without obvious abnormality, atraumatic. Neck: no adenopathy, supple, symmetrical, trachea midline; thyroid not enlarged, symmetric, no tenderness/mass/nodules. Lungs: clear to auscultation bilaterally. Breasts: inspection negative, no nipple retraction or dimpling, no nipple discharge or bleeding, no axillary or supraclavicular adenopathy, normal to palpation without dominant masses. Heart: regular rate and rhythm Abdomen: soft, non-tender; no masses,  no organomegaly. Extremities: extremities normal, atraumatic, no cyanosis or edema. Skin: skin color, texture, turgor normal, no rashes or lesions. Lymph: cervical, supraclavicular, and axillary nodes normal; no abnormal inguinal nodes palpated. Neurologic: grossly normal.  Pelvic:  External genitalia: no lesions. Urethra: normal appearing urethra with no masses, tenderness or lesions. Bartholin's and Skene's: normal. Vagina: normal appearing vagina with normal color and discharge, no lesions. Cervix: normal appearance. Pap and high risk HPV testing done: {yes no:314532} Uterus: uterus is normal size, shape, consistency and nontender. Adnexa: normal adnexa in size, nontender and no masses.                                      Assessment/Plan:   There are no diagnoses linked to this encounter.  Patient Counseling: [x]    Nutrition: Stressed importance of moderation in sodium/caffeine intake, saturated fat and cholesterol, caloric balance, sufficient intake of fresh fruits, vegetables, fiber, calcium,  iron, and 1 mg of folate supplement per day (for females capable of pregnancy).  [x]    Stressed the importance of regular exercise.   [x]    Substance Abuse: Discussed cessation/primary prevention of tobacco, alcohol, or other drug use; driving or other dangerous activities under the influence; availability of treatment for abuse.   [x]    Injury prevention: Discussed safety belts, safety helmets, smoke detector, smoking near  bedding or upholstery.   [x]    Sexuality: Discussed sexually transmitted diseases, partner selection, use of condoms, avoidance of unintended pregnancy  and contraceptive alternatives.  [x]    Dental health: Discussed importance of regular tooth brushing, flossing, and dental visits.  [x]    Health maintenance and immunizations reviewed. Please refer to Health maintenance section.   Briscoe Deutscher, DO [place]

## 2017-09-13 NOTE — Patient Instructions (Signed)
I will contact you with your lab results.  Please schedule a mammogram and a diabetic eye exam at your convienence.  Health Maintenance, Female Adopting a healthy lifestyle and getting preventive care can go a long way to promote health and wellness. Talk with your health care provider about what schedule of regular examinations is right for you. This is a good chance for you to check in with your provider about disease prevention and staying healthy. In between checkups, there are plenty of things you can do on your own. Experts have done a lot of research about which lifestyle changes and preventive measures are most likely to keep you healthy. Ask your health care provider for more information. Weight and diet Eat a healthy diet  Be sure to include plenty of vegetables, fruits, low-fat dairy products, and lean protein.  Do not eat a lot of foods high in solid fats, added sugars, or salt.  Get regular exercise. This is one of the most important things you can do for your health. ? Most adults should exercise for at least 150 minutes each week. The exercise should increase your heart rate and make you sweat (moderate-intensity exercise). ? Most adults should also do strengthening exercises at least twice a week. This is in addition to the moderate-intensity exercise.  Maintain a healthy weight  Body mass index (BMI) is a measurement that can be used to identify possible weight problems. It estimates body fat based on height and weight. Your health care provider can help determine your BMI and help you achieve or maintain a healthy weight.  For females 75 years of age and older: ? A BMI below 18.5 is considered underweight. ? A BMI of 18.5 to 24.9 is normal. ? A BMI of 25 to 29.9 is considered overweight. ? A BMI of 30 and above is considered obese.  Watch levels of cholesterol and blood lipids  You should start having your blood tested for lipids and cholesterol at 45 years of age,  then have this test every 5 years.  You may need to have your cholesterol levels checked more often if: ? Your lipid or cholesterol levels are high. ? You are older than 45 years of age. ? You are at high risk for heart disease.  Cancer screening Lung Cancer  Lung cancer screening is recommended for adults 81-49 years old who are at high risk for lung cancer because of a history of smoking.  A yearly low-dose CT scan of the lungs is recommended for people who: ? Currently smoke. ? Have quit within the past 15 years. ? Have at least a 30-pack-year history of smoking. A pack year is smoking an average of one pack of cigarettes a day for 1 year.  Yearly screening should continue until it has been 15 years since you quit.  Yearly screening should stop if you develop a health problem that would prevent you from having lung cancer treatment.  Breast Cancer  Practice breast self-awareness. This means understanding how your breasts normally appear and feel.  It also means doing regular breast self-exams. Let your health care provider know about any changes, no matter how small.  If you are in your 20s or 30s, you should have a clinical breast exam (CBE) by a health care provider every 1-3 years as part of a regular health exam.  If you are 52 or older, have a CBE every year. Also consider having a breast X-ray (mammogram) every year.  If you have  a family history of breast cancer, talk to your health care provider about genetic screening.  If you are at high risk for breast cancer, talk to your health care provider about having an MRI and a mammogram every year.  Breast cancer gene (BRCA) assessment is recommended for women who have family members with BRCA-related cancers. BRCA-related cancers include: ? Breast. ? Ovarian. ? Tubal. ? Peritoneal cancers.  Results of the assessment will determine the need for genetic counseling and BRCA1 and BRCA2 testing.  Cervical Cancer Your  health care provider may recommend that you be screened regularly for cancer of the pelvic organs (ovaries, uterus, and vagina). This screening involves a pelvic examination, including checking for microscopic changes to the surface of your cervix (Pap test). You may be encouraged to have this screening done every 3 years, beginning at age 69.  For women ages 53-65, health care providers may recommend pelvic exams and Pap testing every 3 years, or they may recommend the Pap and pelvic exam, combined with testing for human papilloma virus (HPV), every 5 years. Some types of HPV increase your risk of cervical cancer. Testing for HPV may also be done on women of any age with unclear Pap test results.  Other health care providers may not recommend any screening for nonpregnant women who are considered low risk for pelvic cancer and who do not have symptoms. Ask your health care provider if a screening pelvic exam is right for you.  If you have had past treatment for cervical cancer or a condition that could lead to cancer, you need Pap tests and screening for cancer for at least 20 years after your treatment. If Pap tests have been discontinued, your risk factors (such as having a new sexual partner) need to be reassessed to determine if screening should resume. Some women have medical problems that increase the chance of getting cervical cancer. In these cases, your health care provider may recommend more frequent screening and Pap tests.  Colorectal Cancer  This type of cancer can be detected and often prevented.  Routine colorectal cancer screening usually begins at 45 years of age and continues through 45 years of age.  Your health care provider may recommend screening at an earlier age if you have risk factors for colon cancer.  Your health care provider may also recommend using home test kits to check for hidden blood in the stool.  A small camera at the end of a tube can be used to examine your  colon directly (sigmoidoscopy or colonoscopy). This is done to check for the earliest forms of colorectal cancer.  Routine screening usually begins at age 54.  Direct examination of the colon should be repeated every 5-10 years through 45 years of age. However, you may need to be screened more often if early forms of precancerous polyps or small growths are found.  Skin Cancer  Check your skin from head to toe regularly.  Tell your health care provider about any new moles or changes in moles, especially if there is a change in a mole's shape or color.  Also tell your health care provider if you have a mole that is larger than the size of a pencil eraser.  Always use sunscreen. Apply sunscreen liberally and repeatedly throughout the day.  Protect yourself by wearing long sleeves, pants, a wide-brimmed hat, and sunglasses whenever you are outside.  Heart disease, diabetes, and high blood pressure  High blood pressure causes heart disease and increases the risk  of stroke. High blood pressure is more likely to develop in: ? People who have blood pressure in the high end of the normal range (130-139/85-89 mm Hg). ? People who are overweight or obese. ? People who are African American.  If you are 59-62 years of age, have your blood pressure checked every 3-5 years. If you are 86 years of age or older, have your blood pressure checked every year. You should have your blood pressure measured twice-once when you are at a hospital or clinic, and once when you are not at a hospital or clinic. Record the average of the two measurements. To check your blood pressure when you are not at a hospital or clinic, you can use: ? An automated blood pressure machine at a pharmacy. ? A home blood pressure monitor.  If you are between 15 years and 33 years old, ask your health care provider if you should take aspirin to prevent strokes.  Have regular diabetes screenings. This involves taking a blood sample  to check your fasting blood sugar level. ? If you are at a normal weight and have a low risk for diabetes, have this test once every three years after 45 years of age. ? If you are overweight and have a high risk for diabetes, consider being tested at a younger age or more often. Preventing infection Hepatitis B  If you have a higher risk for hepatitis B, you should be screened for this virus. You are considered at high risk for hepatitis B if: ? You were born in a country where hepatitis B is common. Ask your health care provider which countries are considered high risk. ? Your parents were born in a high-risk country, and you have not been immunized against hepatitis B (hepatitis B vaccine). ? You have HIV or AIDS. ? You use needles to inject street drugs. ? You live with someone who has hepatitis B. ? You have had sex with someone who has hepatitis B. ? You get hemodialysis treatment. ? You take certain medicines for conditions, including cancer, organ transplantation, and autoimmune conditions.  Hepatitis C  Blood testing is recommended for: ? Everyone born from [birth year] through 1963. ? Anyone with known risk factors for hepatitis C.  Sexually transmitted infections (STIs)  You should be screened for sexually transmitted infections (STIs) including gonorrhea and chlamydia if: ? You are sexually active and are younger than 45 years of age. ? You are older than 45 years of age and your health care provider tells you that you are at risk for this type of infection. ? Your sexual activity has changed since you were last screened and you are at an increased risk for chlamydia or gonorrhea. Ask your health care provider if you are at risk.  If you do not have HIV, but are at risk, it may be recommended that you take a prescription medicine daily to prevent HIV infection. This is called pre-exposure prophylaxis (PrEP). You are considered at risk if: ? You are sexually active and do not  regularly use condoms or know the HIV status of your partner(s). ? You take drugs by injection. ? You are sexually active with a partner who has HIV.  Talk with your health care provider about whether you are at high risk of being infected with HIV. If you choose to begin PrEP, you should first be tested for HIV. You should then be tested every 3 months for as long as you are taking PrEP. Pregnancy  If you are premenopausal and you may become pregnant, ask your health care provider about preconception counseling.  If you may become pregnant, take 400 to 800 micrograms (mcg) of folic acid every day.  If you want to prevent pregnancy, talk to your health care provider about birth control (contraception). Osteoporosis and menopause  Osteoporosis is a disease in which the bones lose minerals and strength with aging. This can result in serious bone fractures. Your risk for osteoporosis can be identified using a bone density scan.  If you are 57 years of age or older, or if you are at risk for osteoporosis and fractures, ask your health care provider if you should be screened.  Ask your health care provider whether you should take a calcium or vitamin D supplement to lower your risk for osteoporosis.  Menopause may have certain physical symptoms and risks.  Hormone replacement therapy may reduce some of these symptoms and risks. Talk to your health care provider about whether hormone replacement therapy is right for you. Follow these instructions at home:  Schedule regular health, dental, and eye exams.  Stay current with your immunizations.  Do not use any tobacco products including cigarettes, chewing tobacco, or electronic cigarettes.  If you are pregnant, do not drink alcohol.  If you are breastfeeding, limit how much and how often you drink alcohol.  Limit alcohol intake to no more than 1 drink per day for nonpregnant women. One drink equals 12 ounces of beer, 5 ounces of wine, or  1 ounces of hard liquor.  Do not use street drugs.  Do not share needles.  Ask your health care provider for help if you need support or information about quitting drugs.  Tell your health care provider if you often feel depressed.  Tell your health care provider if you have ever been abused or do not feel safe at home. This information is not intended to replace advice given to you by your health care provider. Make sure you discuss any questions you have with your health care provider. Document Released: 07/27/2010 Document Revised: 06/19/2015 Document Reviewed: 10/15/2014 Elsevier Interactive Patient Education  [employer].

## 2017-09-14 ENCOUNTER — Encounter: Payer: Self-pay | Admitting: Physician Assistant

## 2017-09-14 LAB — CBC WITH DIFFERENTIAL/PLATELET
BASOS PCT: 1.2 % (ref 0.0–3.0)
Basophils Absolute: 0.1 10*3/uL (ref 0.0–0.1)
EOS PCT: 4.4 % (ref 0.0–5.0)
Eosinophils Absolute: 0.5 10*3/uL (ref 0.0–0.7)
HEMATOCRIT: 43.8 % (ref 36.0–46.0)
Hemoglobin: 14 g/dL (ref 12.0–15.0)
LYMPHS PCT: 45.4 % (ref 12.0–46.0)
Lymphs Abs: 5.4 10*3/uL — ABNORMAL HIGH (ref 0.7–4.0)
MCHC: 32.1 g/dL (ref 30.0–36.0)
MCV: 78.4 fl (ref 78.0–100.0)
MONOS PCT: 5.5 % (ref 3.0–12.0)
Monocytes Absolute: 0.7 10*3/uL (ref 0.1–1.0)
NEUTROS ABS: 5.2 10*3/uL (ref 1.4–7.7)
Neutrophils Relative %: 43.5 % (ref 43.0–77.0)
PLATELETS: 441 10*3/uL — AB (ref 150.0–400.0)
RBC: 5.59 Mil/uL — ABNORMAL HIGH (ref 3.87–5.11)
RDW: 15.6 % — AB (ref 11.5–15.5)
WBC: 11.9 10*3/uL — ABNORMAL HIGH (ref 4.0–10.5)

## 2017-09-14 LAB — COMPREHENSIVE METABOLIC PANEL
ALBUMIN: 4.2 g/dL (ref 3.5–5.2)
ALT: 20 U/L (ref 0–35)
AST: 16 U/L (ref 0–37)
Alkaline Phosphatase: 75 U/L (ref 39–117)
BILIRUBIN TOTAL: 0.2 mg/dL (ref 0.2–1.2)
BUN: 9 mg/dL (ref 6–23)
CALCIUM: 9.7 mg/dL (ref 8.4–10.5)
CHLORIDE: 103 meq/L (ref 96–112)
CO2: 27 meq/L (ref 19–32)
Creatinine, Ser: 0.93 mg/dL (ref 0.40–1.20)
GFR: 83.61 mL/min (ref >60.00)
Glucose, Bld: 57 mg/dL — ABNORMAL LOW (ref 70–99)
Potassium: 4.1 mEq/L (ref 3.5–5.1)
Sodium: 139 mEq/L (ref 135–145)
Total Protein: 7.3 g/dL (ref 6.0–8.3)

## 2017-09-14 LAB — HEMOGLOBIN A1C: Hgb A1c MFr Bld: 5.8 % (ref 4.6–6.5)

## 2017-09-14 LAB — VITAMIN B12: VITAMIN B 12: 133 pg/mL — AB (ref 211–911)

## 2017-09-14 LAB — MICROALBUMIN / CREATININE URINE RATIO
Creatinine,U: 100.5 mg/dL
MICROALB/CREAT RATIO: 0.7 mg/g (ref 0.0–30.0)
Microalb, Ur: <0.7 mg/dL (ref 0.0–1.9)

## 2017-09-14 LAB — VITAMIN D 25 HYDROXY (VIT D DEFICIENCY, FRACTURES): VITD: 24.55 ng/mL — ABNORMAL LOW (ref 30.00–100.00)

## 2017-09-16 ENCOUNTER — Other Ambulatory Visit: Payer: Self-pay

## 2017-09-16 ENCOUNTER — Telehealth: Payer: Self-pay

## 2017-09-16 NOTE — Progress Notes (Signed)
Prescriptions for Cyanocobalamin 1000 mcg and Topamax 50 mg called in to [facility], per orders of Dr. Juleen China.

## 2017-09-16 NOTE — Progress Notes (Signed)
Prescriptions called in to [facility] for Topamax and Cyanocobalamin per Dr. Juleen China.

## 2017-09-16 NOTE — Telephone Encounter (Signed)
Copied from [place] [id number]. Topic: General - Other >> Sep 15, 2017  2:36 PM Keene Breath wrote: Reason for CRM: Patient is returning call to get her lab results.  NT was not available.  CB# [phone].

## 2017-09-19 MED ORDER — CYANOCOBALAMIN 1000 MCG/ML IJ SOLN: INTRAMUSCULAR | 15 refills | Status: DC

## 2017-09-19 MED ORDER — TOPIRAMATE 50 MG PO TABS: 50.0000 mg | ORAL_TABLET | Freq: Two times a day (BID) | ORAL | 3 refills | Status: DC

## 2017-10-30 ENCOUNTER — Other Ambulatory Visit: Payer: Self-pay | Admitting: Family Medicine

## 2017-10-30 DIAGNOSIS — E119 Type 2 diabetes mellitus without complications: Secondary | ICD-10-CM

## 2017-11-28 ENCOUNTER — Ambulatory Visit: Payer: Self-pay | Admitting: *Deleted

## 2017-11-28 ENCOUNTER — Ambulatory Visit (INDEPENDENT_AMBULATORY_CARE_PROVIDER_SITE_OTHER): Payer: PRIVATE HEALTH INSURANCE | Admitting: Physician Assistant

## 2017-11-28 ENCOUNTER — Encounter: Payer: Self-pay | Admitting: Physician Assistant

## 2017-11-28 VITALS — BP 112/70 | HR 64 | Temp 98.5°F | Ht 61.0 in | Wt 153.2 lb

## 2017-11-28 DIAGNOSIS — M79661 Pain in right lower leg: Secondary | ICD-10-CM

## 2017-11-28 LAB — CBC WITH DIFFERENTIAL/PLATELET
BASOS PCT: 0.6 % (ref 0.0–3.0)
Basophils Absolute: 0.1 10*3/uL (ref 0.0–0.1)
EOS ABS: 0.3 10*3/uL (ref 0.0–0.7)
Eosinophils Relative: 3.2 % (ref 0.0–5.0)
HEMATOCRIT: 40.9 % (ref 36.0–46.0)
HEMOGLOBIN: 13.4 g/dL (ref 12.0–15.0)
Lymphocytes Relative: 47 % — ABNORMAL HIGH (ref 12.0–46.0)
Lymphs Abs: 5 10*3/uL — ABNORMAL HIGH (ref 0.7–4.0)
MCHC: 32.8 g/dL (ref 30.0–36.0)
MCV: 78.3 fl (ref 78.0–100.0)
MONO ABS: 0.6 10*3/uL (ref 0.1–1.0)
Monocytes Relative: 5.2 % (ref 3.0–12.0)
NEUTROS ABS: 4.7 10*3/uL (ref 1.4–7.7)
Neutrophils Relative %: 44 % (ref 43.0–77.0)
PLATELETS: 432 10*3/uL — AB (ref 150.0–400.0)
RBC: 5.22 Mil/uL — ABNORMAL HIGH (ref 3.87–5.11)
RDW: 16.4 % — AB (ref 11.5–15.5)
WBC: 10.7 10*3/uL — AB (ref 4.0–10.5)

## 2017-11-28 LAB — BASIC METABOLIC PANEL
BUN: 7 mg/dL (ref 6–23)
CALCIUM: 9.4 mg/dL (ref 8.4–10.5)
CO2: 28 meq/L (ref 19–32)
Chloride: 105 mEq/L (ref 96–112)
Creatinine, Ser: 0.83 mg/dL (ref 0.40–1.20)
GFR: 95.26 mL/min (ref >60.00)
GLUCOSE: 75 mg/dL (ref 70–99)
Potassium: 4.1 mEq/L (ref 3.5–5.1)
SODIUM: 139 meq/L (ref 135–145)

## 2017-11-28 LAB — CK: CK TOTAL: 1349 U/L — AB (ref 7–177)

## 2017-11-28 NOTE — Progress Notes (Signed)
Anna Huffman is a 47 y.o. female here for a new problem.  I acted as a Education administrator for [employer], PA-C Anselmo Pickler, LPN  History of Present Illness:   Chief Complaint  Patient presents with  . Leg Pain    Leg Pain   Incident onset: Started on Friday and has gotten worse. The incident occurred at home. There was no injury mechanism. The pain is present in the right leg (Calf radiating into right thigh). The quality of the pain is described as aching, cramping and shooting. The pain is at a severity of 7/10. The pain is moderate. The pain has been constant since onset. Associated symptoms include an inability to bear weight, muscle weakness, numbness (Toes right foot) and tingling (Toes right foot). She reports no foreign bodies present. The symptoms are aggravated by movement and weight bearing. She has tried rest and elevation for the symptoms. The treatment provided no relief.   Denies: SOB, chest pain, palpitations, prior personal or family hx of blood clots  She is a current smoker.  Past Medical History:  Diagnosis Date  . Anemia   . Anxiety   . Blood transfusion without reported diagnosis   . Heart murmur      Social History   Socioeconomic History  . Marital status: Single    Spouse name: Not on file  . Number of children: Not on file  . Years of education: Not on file  . Highest education level: Not on file  Occupational History  . Occupation: Research scientist (physical sciences): [employer]  Social Needs  . Financial resource strain: Not on file  . Food insecurity:    Worry: Not on file    Inability: Not on file  . Transportation needs:    Medical: Not on file    Non-medical: Not on file  Tobacco Use  . Smoking status: Current Every Day Smoker    Packs/day: 0.50    Types: Cigarettes  . Smokeless tobacco: Never Used  Substance and Sexual Activity  . Alcohol use: Yes    Comment: social   . Drug use: Yes    Types: Marijuana  . Sexual activity: Yes     Partners: Male  Lifestyle  . Physical activity:    Days per week: Not on file    Minutes per session: Not on file  . Stress: Not on file  Relationships  . Social connections:    Talks on phone: Not on file    Gets together: Not on file    Attends religious service: Not on file    Active member of club or organization: Not on file    Attends meetings of clubs or organizations: Not on file    Relationship status: Not on file  . Intimate partner violence:    Fear of current or ex partner: Not on file    Emotionally abused: Not on file    Physically abused: Not on file    Forced sexual activity: Not on file  Other Topics Concern  . Not on file  Social History Narrative  . Not on file    Past Surgical History:  Procedure Laterality Date  . BREAST REDUCTION SURGERY      Family History  Problem Relation Age of Onset  . Stomach cancer Mother   . Prostate cancer Father     Allergies  Allergen Reactions  . Codeine Itching  . Latex   . Wellbutrin [Bupropion] Itching    Current Medications:  Current Outpatient Medications:  .  TRULICITY 6.23 JS/2.8BT SOPN, INJECT 0.75MG  SUBCUTANEOUSLY EVERY WEEK FOR 2 WEEKS THEN INCREASE TO 1.5MG  IF TOLERATED, Disp: 2 mL, Rfl: 1 .  Cholecalciferol 50000 units TABS, 50,000 units PO qwk for 12 weeks. (Patient not taking: Reported on 11/28/2017), Disp: 12 tablet, Rfl: 0   Review of Systems:   Review of Systems  Neurological: Positive for tingling (Toes right foot) and numbness (Toes right foot).  Negative unless otherwise specified per HPI.   Vitals:   Vitals:   11/28/17 1147  BP: 112/70  Pulse: 64  Temp: 98.5 F (36.9 C)  TempSrc: Oral  SpO2: 100%  Weight: 153 lb 4 oz (69.5 kg)  Height: 5\' 1"  (1.549 m)     Body mass index is 28.96 kg/m.  Physical Exam:   Physical Exam  Constitutional: She appears well-developed. She is cooperative.  Non-toxic appearance. She does not have a sickly appearance. She does not appear ill. No  distress.  Cardiovascular: Normal rate, regular rhythm, S1 normal, S2 normal, normal heart sounds and normal pulses.  Pulses:      Dorsalis pedis pulses are 2+ on the right side, and 2+ on the left side.       Posterior tibial pulses are 2+ on the right side, and 2+ on the left side.  No LE edema  Pulmonary/Chest: Effort normal and breath sounds normal.  Musculoskeletal:  R medial calf muscle TTP, no erythema, ecchymosis, or swelling  Pain elicited with flexion and extension of calf muscle  No TTP to L quad or hamstring  Neurological: She is alert. GCS eye subscore is 4. GCS verbal subscore is 5. GCS motor subscore is 6.  Normal sensation to BLE  Skin: Skin is warm, dry and intact.  Psychiatric: She has a normal mood and affect. Her speech is normal and behavior is normal.  Nursing note and vitals reviewed.   Assessment and Plan:    Talicia was seen today for leg pain.  Diagnoses and all orders for this visit:  Right calf pain -     CBC with Differential/Platelet -     CK (Creatine Kinase) -     Basic metabolic panel -     VAS [place] LOWER EXTREMITY VENOUS (DVT); Future   Labs reassuring. She is requesting ultrasound to r/o DVT. She states that she did an "excessive" amount of walking around this weekend, will check CK level as well. Further intervention based on lab and imaging results. I did tell patient that in the meantime if she develops any sudden onset SOB, CP or other worrisome symptoms to go to the ER.  . Reviewed expectations re: course of current medical issues. . Discussed self-management of symptoms. . Outlined signs and symptoms indicating need for more acute intervention. . Patient verbalized understanding and all questions were answered. . See orders for this visit as documented in the electronic medical record. . Patient received an After-Visit Summary.  CMA or LPN served as scribe during this visit. History, Physical, and Plan performed by medical provider.  The above documentation has been reviewed and is accurate and complete.  Inda Coke, PA-C

## 2017-11-28 NOTE — Telephone Encounter (Signed)
Patient reports new onset calf pain that hurts to walk on- she states the pain has started radiating into the thigh. Appointment made for evaluation of pain.  Reason for Disposition . [1] Thigh or calf pain AND [2] only 1 side AND [3] present > 1 hour  Answer Assessment - Initial Assessment Questions 1. ONSET: "When did the pain start?"      Started Friday- getting worse yesterday with "charlie horse in leg" 2. LOCATION: "Where is the pain located?"      R leg- calf muscle, left side of calf- hurts to walk- feels warm and hard 3. PAIN: "How bad is the pain?"    (Scale 1-10; or mild, moderate, severe)   -  MILD (1-3): doesn't interfere with normal activities    -  MODERATE (4-7): interferes with normal activities (e.g., work or school) or awakens from sleep, limping    -  SEVERE (8-10): excruciating pain, unable to do any normal activities, unable to walk     7-8 4. WORK OR EXERCISE: "Has there been any recent work or exercise that involved this part of the body?"      Parade this week end- patient states she was in pain before the parade 5. CAUSE: "What do you think is causing the leg pain?"     Not sure- patient is B12 deficient she is concerned about clot 6. OTHER SYMPTOMS: "Do you have any other symptoms?" (e.g., chest pain, back pain, breathing difficulty, swelling, rash, fever, numbness, weakness)     No- did notice soreness in thigh of that leg 7. PREGNANCY: "Is there any chance you are pregnant?" "When was your last menstrual period?"     n/a  Protocols used: LEG PAIN-A-AH

## 2017-11-28 NOTE — Patient Instructions (Addendum)
It was great to see you!  Take care,  Keisi Eckford PA-C  

## 2017-11-29 ENCOUNTER — Other Ambulatory Visit: Payer: Self-pay | Admitting: Physician Assistant

## 2017-11-29 ENCOUNTER — Ambulatory Visit (HOSPITAL_COMMUNITY)
Admission: RE | Admit: 2017-11-29 | Discharge: 2017-11-29 | Disposition: A | Discharge status: Home / Self Care | Payer: PRIVATE HEALTH INSURANCE | Source: Ambulatory Visit | Attending: Family Medicine | Admitting: Family Medicine

## 2017-11-29 DIAGNOSIS — M79661 Pain in right lower leg: Secondary | ICD-10-CM | POA: Insufficient documentation

## 2017-11-29 DIAGNOSIS — R748 Abnormal levels of other serum enzymes: Secondary | ICD-10-CM

## 2017-12-06 ENCOUNTER — Other Ambulatory Visit: Payer: PRIVATE HEALTH INSURANCE

## 2018-09-28 NOTE — Progress Notes (Deleted)
Anna Huffman is a 48 y.o. female is here for follow up.  History of Present Illness:   (SCRIBE ATTESTATION)  HPI:   Health Maintenance Due  Topic Date Due  . OPHTHALMOLOGY EXAM  02/03/1982  . FOOT EXAM  02/28/2018  . HEMOGLOBIN A1C  03/16/2018  . URINE MICROALBUMIN  09/14/2018   Depression screen PHQ 2/9 02/28/2017  Decreased Interest 0  Down, Depressed, Hopeless 0  PHQ - 2 Score 0   PMHx, SurgHx, SocialHx, FamHx, Medications, and Allergies were reviewed in the Visit Navigator and updated as appropriate.   Patient Active Problem List   Diagnosis Date Noted  . Type 2 diabetes mellitus without complication, without long-term current use of insulin ([place]) 04/23/2016  . Seasonal allergies 04/23/2016  . Class 1 obesity due to excess calories without serious comorbidity with body mass index (BMI) of 30.0 to 30.9 in adult 03/27/2016  . Smoker 03/27/2016  . Anxiety   . Fibroid, uterine 07/23/2013   Social History   Tobacco Use  . Smoking status: Current Every Day Smoker    Packs/day: 0.50    Types: Cigarettes  . Smokeless tobacco: Never Used  Substance Use Topics  . Alcohol use: Yes    Comment: social   . Drug use: Yes    Types: Marijuana   Current Medications and Allergies   Current Outpatient Medications:  .  Cholecalciferol 50000 units TABS, 50,000 units PO qwk for 12 weeks. (Patient not taking: Reported on 11/28/2017), Disp: 12 tablet, Rfl: 0 .  TRULICITY [id number] 0000000 SOPN, INJECT 0.75MG  SUBCUTANEOUSLY EVERY WEEK FOR 2 WEEKS THEN INCREASE TO 1.5MG  IF TOLERATED, Disp: 2 mL, Rfl: 1  Allergies  Allergen Reactions  . Codeine Itching  . Latex   . Wellbutrin [Bupropion] Itching   Review of Systems   Pertinent items are noted in the HPI. Otherwise, a complete ROS is negative.  Vitals  There were no vitals filed for this visit.   There is no height or weight on file to calculate BMI.  Physical Exam   Physical Exam  Results for orders placed or performed in  visit on 11/28/17  CBC with Differential/Platelet  Result Value Ref Range   WBC 10.7 (H) 4.0 - 10.5 K/uL   RBC 5.22 (H) 3.87 - 5.11 Mil/uL   Hemoglobin 13.4 12.0 - 15.0 g/dL   HCT 40.9 36.0 - 46.0 %   MCV 78.3 78.0 - 100.0 fl   MCHC 32.8 30.0 - 36.0 g/dL   RDW 16.4 (H) 11.5 - 15.5 %   Platelets 432.0 (H) 150.0 - 400.0 K/uL   Neutrophils Relative % 44.0 43.0 - 77.0 %   Lymphocytes Relative 47.0 (H) 12.0 - 46.0 %   Monocytes Relative 5.2 3.0 - 12.0 %   Eosinophils Relative 3.2 0.0 - 5.0 %   Basophils Relative 0.6 0.0 - 3.0 %   Neutro Abs 4.7 1.4 - 7.7 K/uL   Lymphs Abs 5.0 (H) 0.7 - 4.0 K/uL   Monocytes Absolute 0.6 0.1 - 1.0 K/uL   Eosinophils Absolute 0.3 0.0 - 0.7 K/uL   Basophils Absolute 0.1 0.0 - 0.1 K/uL  CK (Creatine Kinase)  Result Value Ref Range   Total CK 1,349 (H) 7 - 177 U/L  Basic metabolic panel  Result Value Ref Range   Sodium 139 135 - 145 mEq/L   Potassium 4.1 3.5 - 5.1 mEq/L   Chloride 105 96 - 112 mEq/L   CO2 28 19 - 32 mEq/L   Glucose,  Bld 75 70 - 99 mg/dL   BUN 7 6 - 23 mg/dL   Creatinine, Ser 0.83 0.40 - 1.20 mg/dL   Calcium 9.4 8.4 - 10.5 mg/dL   GFR 95.26 >60.00 mL/min    Assessment and Plan   Diagnoses and all orders for this visit:  Type 2 diabetes mellitus without complication, without long-term current use of insulin ([facility])  Anxiety  Class 1 obesity due to excess calories without serious comorbidity with body mass index (BMI) of 30.0 to 30.9 in adult    . Orders and follow up as documented in [facility], reviewed diet, exercise and weight control, cardiovascular risk and specific lipid/LDL goals reviewed, reviewed medications and side effects in detail.  . Reviewed expectations re: course of current medical issues. . Outlined signs and symptoms indicating need for more acute intervention. . Patient verbalized understanding and all questions were answered. . Patient received an After Visit Summary.  *** CMA served as Education administrator during this  visit. History, Physical, and Plan performed by medical provider. The above documentation has been reviewed and is accurate and complete. Briscoe Deutscher, [place], [place], [place] [facility] 09/28/2018

## 2018-09-29 ENCOUNTER — Ambulatory Visit: Payer: PRIVATE HEALTH INSURANCE | Admitting: Family Medicine

## 2018-10-05 ENCOUNTER — Encounter: Payer: Self-pay | Admitting: Family Medicine

## 2018-10-05 ENCOUNTER — Ambulatory Visit (INDEPENDENT_AMBULATORY_CARE_PROVIDER_SITE_OTHER): Payer: PRIVATE HEALTH INSURANCE | Admitting: Family Medicine

## 2018-10-05 VITALS — Temp 97.6°F | Ht 61.0 in | Wt 166.2 lb

## 2018-10-05 DIAGNOSIS — E6609 Other obesity due to excess calories: Secondary | ICD-10-CM | POA: Diagnosis not present

## 2018-10-05 DIAGNOSIS — E119 Type 2 diabetes mellitus without complications: Secondary | ICD-10-CM | POA: Diagnosis not present

## 2018-10-05 DIAGNOSIS — F17200 Nicotine dependence, unspecified, uncomplicated: Secondary | ICD-10-CM

## 2018-10-05 DIAGNOSIS — E559 Vitamin D deficiency, unspecified: Secondary | ICD-10-CM | POA: Insufficient documentation

## 2018-10-05 DIAGNOSIS — Z6830 Body mass index (BMI) 30.0-30.9, adult: Secondary | ICD-10-CM

## 2018-10-05 MED ORDER — TRULICITY 0.75 MG/0.5ML ~~LOC~~ SOAJ: SUBCUTANEOUS | 11 refills | Status: DC

## 2018-10-05 NOTE — Progress Notes (Signed)
Virtual Visit via Video   Due to the COVID-19 pandemic, this visit was completed with telemedicine (audio/video) technology to reduce patient and provider exposure as well as to preserve personal protective equipment.   I connected with Anna Huffman by a video enabled telemedicine application and verified that I am speaking with the correct person using two identifiers. Location patient: Home Location provider: [facility], Office Persons participating in the virtual visit: DONNAH SCICCHITANO, Briscoe Deutscher, DO   I discussed the limitations of evaluation and management by telemedicine and the availability of in person appointments. The patient expressed understanding and agreed to proceed.  Care Team   Patient Care Team: Briscoe Deutscher, DO as PCP - General (Family Medicine)  Subjective:   HPI: See Assessment and Plan section for Problem Based Charting of issues discussed today.   Review of Systems  Constitutional: Negative for chills, fever, malaise/fatigue and weight loss.  Respiratory: Negative for cough, shortness of breath and wheezing.   Cardiovascular: Negative for chest pain, palpitations and leg swelling.  Gastrointestinal: Negative for abdominal pain, constipation, diarrhea, nausea and vomiting.  Genitourinary: Negative for dysuria and urgency.  Musculoskeletal: Negative for joint pain and myalgias.  Skin: Negative for rash.  Neurological: Negative for dizziness and headaches.  Psychiatric/Behavioral: Negative for depression, substance abuse and suicidal ideas. The patient is not nervous/anxious.     Patient Active Problem List   Diagnosis Date Noted  . Vitamin D deficiency 10/05/2018  . Type 2 diabetes mellitus without complication, without long-term current use of insulin ([place]) 04/23/2016  . Seasonal allergies 04/23/2016  . Class 1 obesity due to excess calories without serious comorbidity with body mass index (BMI) of 30.0 to 30.9 in adult 03/27/2016  . Smoker  03/27/2016  . Anxiety   . Fibroid, uterine 07/23/2013    Social History   Tobacco Use  . Smoking status: Current Every Day Smoker    Packs/day: 0.50    Types: Cigarettes  . Smokeless tobacco: Never Used  Substance Use Topics  . Alcohol use: Yes    Comment: social     Current Outpatient Medications:  .  Dulaglutide (TRULICITY) [id number] 0000000 SOPN, INJECT 0.75MG  SUBCUTANEOUSLY EVERY WEEK, Disp: 4 pen, Rfl: 11  Allergies  Allergen Reactions  . Codeine Itching  . Latex   . Wellbutrin [Bupropion] Itching   Objective:   VITALS: Per patient if applicable, see vitals. GENERAL: Alert, appears well and in no acute distress. HEENT: Atraumatic, conjunctiva clear, no obvious abnormalities on inspection of external nose and ears. NECK: Normal movements of the head and neck. CARDIOPULMONARY: No increased WOB. Speaking in clear sentences. I:E ratio WNL.  MS: Moves all visible extremities without noticeable abnormality. PSYCH: Pleasant and cooperative, well-groomed. Speech normal rate and rhythm. Affect is appropriate. Insight and judgement are appropriate. Attention is focused, linear, and appropriate.  NEURO: CN grossly intact. Oriented as arrived to appointment on time with no prompting. Moves both UE equally.  SKIN: No obvious lesions, wounds, erythema, or cyanosis noted on face or hands.  Depression screen [facility] 2/9 10/05/2018 02/28/2017  Decreased Interest 0 0  Down, Depressed, Hopeless 0 0  PHQ - 2 Score 0 0  Altered sleeping 0 -  Tired, decreased energy 3 -  Change in appetite 3 -  Feeling bad or failure about yourself  0 -  Trouble concentrating 0 -  Moving slowly or fidgety/restless 0 -  Suicidal thoughts 0 -  PHQ-9 Score 6 -  Difficult doing work/chores Somewhat difficult -  Assessment and Plan:   Anna Huffman was seen today for diabetes.  Diagnoses and all orders for this visit:  Type 2 diabetes mellitus without complication, without long-term current use of insulin  ([place]) Comments: Has been off of Trulicity. DC'd at previous visit? Will restart and recheck with labs in 3 months. Reviewed last labs.  Orders: -     Dulaglutide (TRULICITY) [id number] 0000000 SOPN; INJECT 0.75MG  SUBCUTANEOUSLY EVERY WEEK  Class 1 obesity due to excess calories without serious comorbidity with body mass index (BMI) of 30.0 to 30.9 in adult Comments: Weight gain since DC of Trulicity. Will restart. Reviewed healthy food choices and exercise.   Smoker Comments: Encouraged smoking cessation.  Vitamin D deficiency    . COVID-19 Education: The signs and symptoms of COVID-19 were discussed with the patient and how to seek care for testing if needed. The importance of social distancing was discussed today. . Reviewed expectations re: course of current medical issues. . Discussed self-management of symptoms. . Outlined signs and symptoms indicating need for more acute intervention. . Patient verbalized understanding and all questions were answered. Marland Kitchen Health Maintenance issues including appropriate healthy diet, exercise, and smoking avoidance were discussed with patient. . See orders for this visit as documented in the electronic medical record.  Briscoe Deutscher, DO  Records requested if needed. Time spent: 25 minutes, of which >50% was spent in obtaining information about her symptoms, reviewing her previous labs, evaluations, and treatments, counseling her about her condition (please see the discussed topics above), and developing a plan to further investigate it; she had a number of questions which I addressed.

## 2018-10-12 ENCOUNTER — Encounter (HOSPITAL_COMMUNITY): Payer: Self-pay | Admitting: Emergency Medicine

## 2018-10-12 ENCOUNTER — Emergency Department (HOSPITAL_COMMUNITY)
Admission: EM | Admit: 2018-10-12 | Discharge: 2018-10-12 | Disposition: A | Discharge status: Home / Self Care | Payer: PRIVATE HEALTH INSURANCE | Attending: Emergency Medicine | Admitting: Emergency Medicine

## 2018-10-12 ENCOUNTER — Other Ambulatory Visit: Payer: Self-pay

## 2018-10-12 ENCOUNTER — Emergency Department (HOSPITAL_COMMUNITY): Payer: PRIVATE HEALTH INSURANCE

## 2018-10-12 DIAGNOSIS — Z91040 Latex allergy status: Secondary | ICD-10-CM | POA: Diagnosis not present

## 2018-10-12 DIAGNOSIS — S82832A Other fracture of upper and lower end of left fibula, initial encounter for closed fracture: Secondary | ICD-10-CM | POA: Insufficient documentation

## 2018-10-12 DIAGNOSIS — X500XXA Overexertion from strenuous movement or load, initial encounter: Secondary | ICD-10-CM | POA: Insufficient documentation

## 2018-10-12 DIAGNOSIS — F17210 Nicotine dependence, cigarettes, uncomplicated: Secondary | ICD-10-CM | POA: Insufficient documentation

## 2018-10-12 DIAGNOSIS — S99912A Unspecified injury of left ankle, initial encounter: Secondary | ICD-10-CM | POA: Diagnosis present

## 2018-10-12 DIAGNOSIS — Y929 Unspecified place or not applicable: Secondary | ICD-10-CM | POA: Insufficient documentation

## 2018-10-12 DIAGNOSIS — Y999 Unspecified external cause status: Secondary | ICD-10-CM | POA: Insufficient documentation

## 2018-10-12 DIAGNOSIS — Y9301 Activity, walking, marching and hiking: Secondary | ICD-10-CM | POA: Diagnosis not present

## 2018-10-12 DIAGNOSIS — Z79899 Other long term (current) drug therapy: Secondary | ICD-10-CM | POA: Insufficient documentation

## 2018-10-12 DIAGNOSIS — S82839A Other fracture of upper and lower end of unspecified fibula, initial encounter for closed fracture: Secondary | ICD-10-CM

## 2018-10-12 DIAGNOSIS — E119 Type 2 diabetes mellitus without complications: Secondary | ICD-10-CM | POA: Diagnosis not present

## 2018-10-12 MED ORDER — HYDROCODONE-ACETAMINOPHEN 5-325 MG PO TABS
1.0000 | ORAL_TABLET | Freq: Once | ORAL | Status: AC
  Administered 2018-10-12: 1 via ORAL
  Filled 2018-10-12: qty 1

## 2018-10-12 MED ORDER — HYDROCODONE-ACETAMINOPHEN 5-325 MG PO TABS: 1.0000 | ORAL_TABLET | Freq: Four times a day (QID) | ORAL | 0 refills | Status: DC | PRN

## 2018-10-12 NOTE — ED Triage Notes (Signed)
Patient c/o left foot pain. Reports feeling pop after tripping while walking her dog. States pain worsens with movement.

## 2018-10-12 NOTE — Discharge Instructions (Signed)
Continue taking home medication as prescribed. Use Tylenol and ibuprofen as needed for mild to moderate pain. Use Norco as needed for severe breakthrough pain.  Have caution, this may make you tired or groggy.  Do not drive or operate machinery when taking this medicine. Keep your foot elevated. Apply ice, 20 minutes at a time, 3 times a day. Follow-up with orthopedic doctor listed below if your symptoms not improving in the next week. Return to the emergency room with any new, worsening, concerning symptoms.

## 2018-10-13 NOTE — ED Provider Notes (Signed)
[facility] DEPT Provider Note   CSN: [id number] Arrival date & time: 10/12/18  2126     History   Chief Complaint Chief Complaint  Patient presents with  . Foot Injury    HPI Anna Huffman is a 48 y.o. female presenting for evaluation of left ankle pain.  Patient states she was walking her dog when her dog went one direction she went the other.  She sharply inverted her ankle, and felt a pop.  Since then, she has been having severe pain of her lateral left ankle.  Is constant, worse with weightbearing and movement.  She took her tylenol #3s she was given for her dental pain without improvement. Has not had anything else.  Denies injury elsewhere.  She denies numbness or tingling.     HPI  Past Medical History:  Diagnosis Date  . Anemia   . Anxiety   . Blood transfusion without reported diagnosis   . Heart murmur     Patient Active Problem List   Diagnosis Date Noted  . Vitamin D deficiency 10/05/2018  . Type 2 diabetes mellitus without complication, without long-term current use of insulin ([place]) 04/23/2016  . Seasonal allergies 04/23/2016  . Class 1 obesity due to excess calories without serious comorbidity with body mass index (BMI) of 30.0 to 30.9 in adult 03/27/2016  . Smoker 03/27/2016  . Anxiety   . Fibroid, uterine 07/23/2013    Past Surgical History:  Procedure Laterality Date  . BREAST REDUCTION SURGERY       OB History   No obstetric history on file.      Home Medications    Prior to Admission medications   Medication Sig Start Date End Date Taking? Authorizing Provider  Dulaglutide (TRULICITY) [id number] [id number] SOPN INJECT 0.75MG  SUBCUTANEOUSLY EVERY WEEK 10/05/18   Briscoe Deutscher, DO  HYDROcodone-acetaminophen (NORCO/VICODIN) 5-325 MG tablet Take 1 tablet by mouth every 6 (six) hours as needed for severe pain. 10/12/18   Nathin Saran, PA-C    Family History Family History  Problem Relation Age of Onset  .  Stomach cancer Mother   . Prostate cancer Father     Social History Social History   Tobacco Use  . Smoking status: Current Every Day Smoker    Packs/day: 0.50    Types: Cigarettes  . Smokeless tobacco: Never Used  Substance Use Topics  . Alcohol use: Yes    Comment: social   . Drug use: Yes    Types: Marijuana     Allergies   Codeine, Latex, and Wellbutrin [bupropion]   Review of Systems Review of Systems  Musculoskeletal: Positive for arthralgias and joint swelling.  Neurological: Negative for numbness.     Physical Exam Updated Vital Signs BP (!) 144/105 (BP Location: Right Arm)   Pulse 100   Temp 98.5 F (36.9 C) (Oral)   Resp 18   Ht 5\' 1"  (1.549 m)   Wt 72.6 kg   SpO2 100%   BMI 30.23 kg/m   Physical Exam Vitals signs and nursing note reviewed.  Constitutional:      General: She is not in acute distress.    Appearance: She is well-developed.  HENT:     Head: Normocephalic and atraumatic.  Neck:     Musculoskeletal: Normal range of motion.  Pulmonary:     Effort: Pulmonary effort is normal.  Abdominal:     General: There is no distension.  Musculoskeletal:        General: Swelling  and tenderness present.     Comments: Tenderness and swelling of left lateral malleolus.  No erythema or warmth.  Pedal pulses intact.  Good distal sensation.  No ttp of the lower leg.  Skin:    General: Skin is warm.     Capillary Refill: Capillary refill takes less than 2 seconds.     Findings: No rash.  Neurological:     Mental Status: She is alert and oriented to person, place, and time.      ED Treatments / Results  Labs (all labs ordered are listed, but only abnormal results are displayed) Labs Reviewed - No data to display  EKG None  Radiology Dg Ankle Complete Left  Result Date: 10/12/2018 CLINICAL DATA:  Pain and swelling of the ankle and foot after twisting injury tonight tripping over her dog. EXAM: LEFT ANKLE COMPLETE - 3+ VIEW COMPARISON:   None. FINDINGS: There is a tiny bony avulsion from the tip of the fibula with overlying soft tissue swelling. No fracture or dislocation or joint effusion. IMPRESSION: Tiny bony avulsion from the tip of the fibula with overlying soft tissue swelling. Electronically Signed   By: Lorriane Shire M.D.   On: 10/12/2018 22:17   Dg Foot Complete Left  Result Date: 10/12/2018 CLINICAL DATA:  Left foot pain after twisting injury tonight. EXAM: LEFT FOOT - COMPLETE 3+ VIEW COMPARISON:  None. FINDINGS: There is no evidence of fracture or dislocation. There is no evidence of arthropathy or other focal bone abnormality. Soft tissues are unremarkable. IMPRESSION: Negative. Electronically Signed   By: Lorriane Shire M.D.   On: 10/12/2018 22:18    Procedures Procedures (including critical care time)  Medications Ordered in ED Medications  HYDROcodone-acetaminophen (NORCO/VICODIN) 5-325 MG per tablet 1 tablet (1 tablet Oral Given 10/12/18 2341)     Initial Impression / Assessment and Plan / ED Course  I have reviewed the triage vital signs and the nursing notes.  Pertinent labs & imaging results that were available during my care of the patient were reviewed by me and considered in my medical decision making (see chart for details).        Patient presenting for evaluation of left ankle pain.  Physical exam shows patient is neurovascular intact.  Tenderness and swelling over the lateral malleolus.  X-rays viewed interpreted by me, shows avulsion fracture of the distal fibula.  Discussed findings with patient.  Discussed treatment with rest, ice, elevation, NSAIDs, Tylenol.  Short course of Norco given, patient placed in cam walker.  Encourage follow-up with orthopedics if symptoms not improving.  At this time, patient appears safe for discharge.  Return precautions given.  Patient states she understands and agrees to plan.  Final Clinical Impressions(s) / ED Diagnoses   Final diagnoses:  Avulsion  fracture of distal fibula    ED Discharge Orders         Ordered    HYDROcodone-acetaminophen (NORCO/VICODIN) 5-325 MG tablet  Every 6 hours PRN     10/12/18 2319           Franchot Heidelberg, PA-C 10/13/18 0122    Palumbo, April, MD 10/13/18 [id number]

## 2019-01-07 ENCOUNTER — Other Ambulatory Visit: Payer: Self-pay | Admitting: Family Medicine

## 2019-01-07 DIAGNOSIS — E119 Type 2 diabetes mellitus without complications: Secondary | ICD-10-CM

## 2019-01-18 ENCOUNTER — Telehealth: Payer: Self-pay | Admitting: Family Medicine

## 2019-01-18 NOTE — Telephone Encounter (Signed)
Called pharmacy gave increase to 1.5.

## 2019-01-18 NOTE — Telephone Encounter (Signed)
Pharm is calling concerning trulicity dose .75 or 1.5 please confirm

## 2019-03-21 ENCOUNTER — Ambulatory Visit (HOSPITAL_COMMUNITY)
Admission: EM | Admit: 2019-03-21 | Discharge: 2019-03-21 | Disposition: A | Discharge status: Home / Self Care | Payer: PRIVATE HEALTH INSURANCE | Attending: Family Medicine | Admitting: Family Medicine

## 2019-03-21 ENCOUNTER — Other Ambulatory Visit: Payer: Self-pay

## 2019-03-21 ENCOUNTER — Encounter (HOSPITAL_COMMUNITY): Payer: Self-pay

## 2019-03-21 DIAGNOSIS — E119 Type 2 diabetes mellitus without complications: Secondary | ICD-10-CM | POA: Insufficient documentation

## 2019-03-21 DIAGNOSIS — F17210 Nicotine dependence, cigarettes, uncomplicated: Secondary | ICD-10-CM | POA: Insufficient documentation

## 2019-03-21 DIAGNOSIS — B349 Viral infection, unspecified: Secondary | ICD-10-CM | POA: Diagnosis not present

## 2019-03-21 DIAGNOSIS — R0981 Nasal congestion: Secondary | ICD-10-CM | POA: Diagnosis present

## 2019-03-21 DIAGNOSIS — Z885 Allergy status to narcotic agent status: Secondary | ICD-10-CM | POA: Insufficient documentation

## 2019-03-21 DIAGNOSIS — R112 Nausea with vomiting, unspecified: Secondary | ICD-10-CM | POA: Insufficient documentation

## 2019-03-21 DIAGNOSIS — Z79899 Other long term (current) drug therapy: Secondary | ICD-10-CM | POA: Diagnosis not present

## 2019-03-21 DIAGNOSIS — Z20822 Contact with and (suspected) exposure to covid-19: Secondary | ICD-10-CM | POA: Diagnosis not present

## 2019-03-21 MED ORDER — ONDANSETRON 4 MG PO TBDP
4.0000 mg | ORAL_TABLET | Freq: Once | ORAL | Status: AC
  Administered 2019-03-21: 4 mg via ORAL

## 2019-03-21 MED ORDER — ONDANSETRON 4 MG PO TBDP
ORAL_TABLET | ORAL | Status: AC
  Filled 2019-03-21: qty 1

## 2019-03-21 MED ORDER — ONDANSETRON HCL 4 MG/2ML IJ SOLN
INTRAMUSCULAR | Status: AC
  Filled 2019-03-21: qty 2

## 2019-03-21 MED ORDER — ONDANSETRON 4 MG PO TBDP: 4.0000 mg | ORAL_TABLET | Freq: Three times a day (TID) | ORAL | 0 refills | Status: DC | PRN

## 2019-03-21 MED ORDER — KETOROLAC TROMETHAMINE 30 MG/ML IJ SOLN
INTRAMUSCULAR | Status: AC
  Filled 2019-03-21: qty 1

## 2019-03-21 MED ORDER — DEXAMETHASONE SODIUM PHOSPHATE 10 MG/ML IJ SOLN
INTRAMUSCULAR | Status: AC
  Filled 2019-03-21: qty 1

## 2019-03-21 MED ORDER — DEXAMETHASONE SODIUM PHOSPHATE 10 MG/ML IJ SOLN
10.0000 mg | Freq: Once | INTRAMUSCULAR | Status: AC
  Administered 2019-03-21: 10:00:00 10 mg via INTRAMUSCULAR

## 2019-03-21 MED ORDER — KETOROLAC TROMETHAMINE 30 MG/ML IJ SOLN
30.0000 mg | Freq: Once | INTRAMUSCULAR | Status: AC
  Administered 2019-03-21: 10:00:00 30 mg via INTRAMUSCULAR

## 2019-03-21 MED ORDER — CETIRIZINE HCL 10 MG PO TABS: 10.0000 mg | ORAL_TABLET | Freq: Every day | ORAL | 0 refills | Status: DC

## 2019-03-21 NOTE — ED Provider Notes (Signed)
[place]    CSN: [id number] Arrival date & time: 03/21/19  [id number]      History   Chief Complaint Chief Complaint  Patient presents with  . Nasal Congestion    HPI Anna Huffman is a 49 y.o. female.   Patient is a 49 year old female past medical history of anemia, anxiety, diabetes.  She presents today with 2 days of nasal congestion, rhinorrhea,  mild cough, migraine, nausea, vomiting,  diarrhea.  Symptoms have been constant.  She was exposed to Covid approximate 1 week ago by a coworker.  She has not taken any medications for symptoms.  Reporting she takes Trulicity for diabetes and her blood sugars have been under control.  No recorded fevers at home but has felt hot and had chills.  No cough, chest pain or shortness of breath.  ROS per HPI      Past Medical History:  Diagnosis Date  . Anemia   . Anxiety   . Blood transfusion without reported diagnosis   . Heart murmur     Patient Active Problem List   Diagnosis Date Noted  . Vitamin D deficiency 10/05/2018  . Type 2 diabetes mellitus without complication, without long-term current use of insulin ([place]) 04/23/2016  . Seasonal allergies 04/23/2016  . Class 1 obesity due to excess calories without serious comorbidity with body mass index (BMI) of 30.0 to 30.9 in adult 03/27/2016  . Smoker 03/27/2016  . Anxiety   . Fibroid, uterine 07/23/2013    Past Surgical History:  Procedure Laterality Date  . BREAST REDUCTION SURGERY      OB History   No obstetric history on file.      Home Medications    Prior to Admission medications   Medication Sig Start Date End Date Taking? Authorizing Provider  cetirizine (ZYRTEC) 10 MG tablet Take 1 tablet (10 mg total) by mouth daily. 03/21/19   Nilza Eaker, Tressia Miners A, NP  Dulaglutide (TRULICITY) A999333 0000000 SOPN Inject 1.5 mg into the skin once a week. 01/18/19   Leamon Arnt, MD  ondansetron (ZOFRAN ODT) 4 MG disintegrating tablet Take 1 tablet (4 mg total) by  mouth every 8 (eight) hours as needed for nausea or vomiting. 03/21/19   Orvan July, NP    Family History Family History  Problem Relation Age of Onset  . Stomach cancer Mother   . Prostate cancer Father     Social History Social History   Tobacco Use  . Smoking status: Current Every Day Smoker    Packs/day: 0.50    Types: Cigarettes  . Smokeless tobacco: Never Used  Substance Use Topics  . Alcohol use: Yes    Comment: social   . Drug use: Yes    Types: Marijuana     Allergies   Codeine, Latex, and Wellbutrin [bupropion]   Review of Systems Review of Systems   Physical Exam Triage Vital Signs ED Triage Vitals  Enc Vitals Group     BP --      Pulse --      Resp --      Temp --      Temp src --      SpO2 --      Weight 03/21/19 0842 157 lb (71.2 kg)     Height --      Head Circumference --      Peak Flow --      Pain Score 03/21/19 0841 9     Pain Loc --  Pain Edu? --      Excl. in [place]? --    No data found.  Updated Vital Signs BP (!) 161/90 (BP Location: Left Arm)   Pulse 94   Temp 98 F (36.7 C) (Oral)   Resp 18   Wt 157 lb (71.2 kg)   SpO2 100%   BMI 29.66 kg/m   Visual Acuity Right Eye Distance:   Left Eye Distance:   Bilateral Distance:    Right Eye Near:   Left Eye Near:    Bilateral Near:     Physical Exam Vitals and nursing note reviewed.  Constitutional:      General: She is not in acute distress.    Appearance: She is ill-appearing. She is not toxic-appearing or diaphoretic.  HENT:     Head: Normocephalic and atraumatic.     Nose: Nose normal.  Eyes:     Comments: Bilateral scleral injection with tearing  Pulmonary:     Effort: Pulmonary effort is normal.  Musculoskeletal:        General: Normal range of motion.     Cervical back: Normal range of motion.  Skin:    General: Skin is warm and dry.  Neurological:     Mental Status: She is alert.  Psychiatric:        Mood and Affect: Mood normal.      UC  Treatments / Results  Labs (all labs ordered are listed, but only abnormal results are displayed) Labs Reviewed  NOVEL CORONAVIRUS, NAA (HOSP ORDER, SEND-OUT TO REF LAB; TAT 18-24 HRS)    EKG   Radiology No results found.  Procedures Procedures (including critical care time)  Medications Ordered in UC Medications  dexamethasone (DECADRON) injection 10 mg (has no administration in time range)  ketorolac (TORADOL) 30 MG/ML injection 30 mg (has no administration in time range)  ondansetron (ZOFRAN-ODT) disintegrating tablet 4 mg (has no administration in time range)    Initial Impression / Assessment and Plan / UC Course  I have reviewed the triage vital signs and the nursing notes.  Pertinent labs & imaging results that were available during my care of the patient were reviewed by me and considered in my medical decision making (see chart for details).     Viral illness-suspicious for Covid. Covid swab sent for testing with labs pending. Medication given here for symptoms to include Zofran, Toradol and Decadron. Recommend rest, hydrate Zyrtec also for symptoms and Zofran sent to the pharmacy for further nausea and vomiting. Extra strength Tylenol along with ibuprofen every 8 hours for pain Follow up as needed for continued or worsening symptoms  Final Clinical Impressions(s) / UC Diagnoses   Final diagnoses:  Viral illness     Discharge Instructions     This is most likely some sort of viral illness. Medicine given here for nausea, vomiting and headache. Sending nausea medicine to the pharmacy Also sending Zyrtec daily for your symptoms. He can take extra strength Tylenol along with 600 mg of ibuprofen every 8 hours. Patient you are drinking plenty of fluids and staying hydrated Follow up as needed for continued or worsening symptoms     ED Prescriptions    Medication Sig Dispense Auth. Provider   ondansetron (ZOFRAN ODT) 4 MG disintegrating tablet Take 1  tablet (4 mg total) by mouth every 8 (eight) hours as needed for nausea or vomiting. 20 tablet Currie Dennin A, NP   cetirizine (ZYRTEC) 10 MG tablet Take 1 tablet (10 mg total) by  mouth daily. 30 tablet Loura Halt A, NP     PDMP not reviewed this encounter.   Loura Halt A, NP 03/21/19 1017

## 2019-03-21 NOTE — ED Triage Notes (Signed)
Pt is here with nasal congestion, mild cough, migraine, diaherra since 2 days. States she was also exposed on Wednesday (03/14/2019) to a coworker.

## 2019-03-21 NOTE — Discharge Instructions (Addendum)
This is most likely some sort of viral illness. Medicine given here for nausea, vomiting and headache. Sending nausea medicine to the pharmacy Also sending Zyrtec daily for your symptoms. He can take extra strength Tylenol along with 600 mg of ibuprofen every 8 hours. Patient you are drinking plenty of fluids and staying hydrated Follow up as needed for continued or worsening symptoms

## 2019-03-23 LAB — NOVEL CORONAVIRUS, NAA (HOSP ORDER, SEND-OUT TO REF LAB; TAT 18-24 HRS): SARS-CoV-2, NAA: NOT DETECTED

## 2019-04-14 ENCOUNTER — Other Ambulatory Visit: Payer: Self-pay | Admitting: Family Medicine

## 2019-04-14 DIAGNOSIS — E119 Type 2 diabetes mellitus without complications: Secondary | ICD-10-CM

## 2019-04-16 NOTE — Telephone Encounter (Signed)
Patient needs a TOC appointment with one of the providers in the office for refills. Her last appointment was >6 mo ago with Dr. Juleen China. Thanks

## 2019-04-16 NOTE — Telephone Encounter (Signed)
See previous message

## 2019-04-16 NOTE — Telephone Encounter (Signed)
Left a voicemail for patient to all office back to get scheduled for TOC

## 2019-04-17 ENCOUNTER — Telehealth: Payer: Self-pay

## 2019-04-17 DIAGNOSIS — E119 Type 2 diabetes mellitus without complications: Secondary | ICD-10-CM

## 2019-04-17 NOTE — Telephone Encounter (Signed)
  LAST APPOINTMENT DATE: 04/14/2019   NEXT APPOINTMENT DATE:@4 /16/2021  MEDICATION:Dulaglutide (TRULICITY) A999333 0000000 SOPN  PHARMACY: [facility] [phone] - [address] [place], [address] AT [address] Phone:  [phone]  Fax:  [phone]       **Let patient know to contact pharmacy at the end of the day to make sure medication is ready. **  ** Please notify patient to allow 48-72 hours to process**  **Encourage patient to contact the pharmacy for refills or they can request refills through [facility]**  CLINICAL FILLS OUT ALL BELOW:   LAST REFILL:  QTY:  REFILL DATE:    OTHER COMMENTS:    Okay for refill?  Please advise

## 2019-04-18 ENCOUNTER — Other Ambulatory Visit: Payer: Self-pay

## 2019-04-18 MED ORDER — TRULICITY 0.75 MG/0.5ML ~~LOC~~ SOAJ: 1.5000 mg | SUBCUTANEOUS | 0 refills | Status: DC

## 2019-04-18 NOTE — Addendum Note (Signed)
Addended by: Doran Clay A on: 04/18/2019 08:04 AM   Modules accepted: Orders

## 2019-05-11 ENCOUNTER — Encounter: Payer: PRIVATE HEALTH INSURANCE | Admitting: Physician Assistant

## 2019-05-28 ENCOUNTER — Encounter: Payer: Self-pay | Admitting: Physician Assistant

## 2019-05-28 ENCOUNTER — Ambulatory Visit (INDEPENDENT_AMBULATORY_CARE_PROVIDER_SITE_OTHER): Payer: PRIVATE HEALTH INSURANCE | Admitting: Physician Assistant

## 2019-05-28 ENCOUNTER — Other Ambulatory Visit: Payer: Self-pay

## 2019-05-28 VITALS — BP 118/72 | HR 106 | Temp 98.4°F | Ht 61.0 in | Wt 159.6 lb

## 2019-05-28 DIAGNOSIS — T7840XS Allergy, unspecified, sequela: Secondary | ICD-10-CM | POA: Diagnosis not present

## 2019-05-28 DIAGNOSIS — E119 Type 2 diabetes mellitus without complications: Secondary | ICD-10-CM | POA: Diagnosis not present

## 2019-05-28 LAB — POCT GLYCOSYLATED HEMOGLOBIN (HGB A1C): Hemoglobin A1C: 5.6 % (ref 4.0–5.6)

## 2019-05-28 MED ORDER — NICOTINE 21 MG/24HR TD PT24: 21.0000 mg | MEDICATED_PATCH | Freq: Every day | TRANSDERMAL | 5 refills | Status: DC

## 2019-05-28 MED ORDER — TRULICITY 0.75 MG/0.5ML ~~LOC~~ SOAJ: 0.7500 mg | SUBCUTANEOUS | 3 refills | Status: DC

## 2019-05-28 MED ORDER — CETIRIZINE HCL 10 MG PO TABS: 10.0000 mg | ORAL_TABLET | Freq: Every day | ORAL | 0 refills | Status: AC

## 2019-05-28 MED ORDER — BUPROPION HCL ER (XL) 150 MG PO TB24: 150.0000 mg | ORAL_TABLET | Freq: Every day | ORAL | 1 refills | Status: DC

## 2019-05-28 MED ORDER — TRULICITY 0.75 MG/0.5ML ~~LOC~~ SOAJ: 1.5000 mg | SUBCUTANEOUS | 0 refills | Status: DC

## 2019-05-28 NOTE — Progress Notes (Signed)
Anna Huffman is a 49 y.o. female is here to transfer of care  I acted as a Education administrator for [employer], PA-C Serita Sheller, [place]  History of Present Illness:   Chief Complaint  Patient presents with  . Transitions Of Care    HPI   Transfer of care TOC from Dr. Juleen China. Pt is ready to start treatment to quit smoking.  Tobacco abuse Currently smoking 2.5 packs day. Used Chantix in the past -- caused nightmares and worsening anxiety. Did have a reduced craving with Wellbutrin but some increase in itching. She would like to trial Wellbutrin again. Did not having any upper airway itching or symptoms when she took the Wellbutrin.  Diabetes 1 year follow-up. Current DM meds: 1.5 mg Trulicity weekly. Blood sugars at home are: 102 first thing in the morning. Patient is compliant with medications. Denies: hypoglycemic or hyperglycemic episodes or symptoms.   Wt Readings from Last 3 Encounters:  05/28/19 159 lb 9.6 oz (72.4 kg)  03/21/19 157 lb (71.2 kg)  10/12/18 160 lb (72.6 kg)    Lab Results  Component Value Date   HGBA1C 5.6 05/28/2019   Allergy state Currently on Zyrtec 10 mg daily and tolerating well. Would like refill.  Health Maintenance Due  Topic Date Due  . OPHTHALMOLOGY EXAM  Never done  . COVID-19 Vaccine (1) Never done  . FOOT EXAM  02/28/2018  . URINE MICROALBUMIN  09/14/2018  . PAP SMEAR-Modifier  03/27/2019    Past Medical History:  Diagnosis Date  . Anemia   . Anxiety   . Blood transfusion without reported diagnosis   . Heart murmur      Social History   Socioeconomic History  . Marital status: Single    Spouse name: Not on file  . Number of children: Not on file  . Years of education: Not on file  . Highest education level: Not on file  Occupational History  . Occupation: Research scientist (physical sciences): [employer]  Tobacco Use  . Smoking status: Current Every Day Smoker    Packs/day: 0.50    Types: Cigarettes  . Smokeless tobacco: Never Used   Substance and Sexual Activity  . Alcohol use: Yes    Comment: social   . Drug use: Yes    Types: Marijuana  . Sexual activity: Yes    Partners: Male  Other Topics Concern  . Not on file  Social History Narrative  . Not on file   Social Determinants of Health   Financial Resource Strain:   . Difficulty of Paying Living Expenses:   Food Insecurity:   . Worried About Charity fundraiser in the Last Year:   . Arboriculturist in the Last Year:   Transportation Needs:   . Film/video editor (Medical):   Marland Kitchen Lack of Transportation (Non-Medical):   Physical Activity:   . Days of Exercise per Week:   . Minutes of Exercise per Session:   Stress:   . Feeling of Stress :   Social Connections:   . Frequency of Communication with Friends and Family:   . Frequency of Social Gatherings with Friends and Family:   . Attends Religious Services:   . Active Member of Clubs or Organizations:   . Attends Archivist Meetings:   Marland Kitchen Marital Status:   Intimate Partner Violence:   . Fear of Current or Ex-Partner:   . Emotionally Abused:   Marland Kitchen Physically Abused:   . Sexually  Abused:     Past Surgical History:  Procedure Laterality Date  . BREAST REDUCTION SURGERY      Family History  Problem Relation Age of Onset  . Stomach cancer Mother   . Prostate cancer Father     PMHx, SurgHx, SocialHx, FamHx, Medications, and Allergies were reviewed in the Visit Navigator and updated as appropriate.   Patient Active Problem List   Diagnosis Date Noted  . Vitamin D deficiency 10/05/2018  . Type 2 diabetes mellitus without complication, without long-term current use of insulin ([place]) 04/23/2016  . Seasonal allergies 04/23/2016  . Class 1 obesity due to excess calories without serious comorbidity with body mass index (BMI) of 30.0 to 30.9 in adult 03/27/2016  . Smoker 03/27/2016  . Anxiety   . Fibroid, uterine 07/23/2013    Social History   Tobacco Use  . Smoking status: Current  Every Day Smoker    Packs/day: 0.50    Types: Cigarettes  . Smokeless tobacco: Never Used  Substance Use Topics  . Alcohol use: Yes    Comment: social   . Drug use: Yes    Types: Marijuana    Current Medications and Allergies:    Current Outpatient Medications:  .  ondansetron (ZOFRAN ODT) 4 MG disintegrating tablet, Take 1 tablet (4 mg total) by mouth every 8 (eight) hours as needed for nausea or vomiting., Disp: 20 tablet, Rfl: 0 .  buPROPion (WELLBUTRIN XL) 150 MG 24 hr tablet, Take 1 tablet (150 mg total) by mouth daily., Disp: 90 tablet, Rfl: 1 .  cetirizine (ZYRTEC) 10 MG tablet, Take 1 tablet (10 mg total) by mouth daily., Disp: 30 tablet, Rfl: 0 .  Dulaglutide (TRULICITY) A999333 0000000 SOPN, Inject 0.75 mg into the skin once a week., Disp: 4 pen, Rfl: 3 .  nicotine (NICODERM CQ - DOSED IN MG/24 HOURS) 21 mg/24hr patch, Place 1 patch (21 mg total) onto the skin daily., Disp: 28 patch, Rfl: 5   Allergies  Allergen Reactions  . Codeine Itching  . Latex   . Wellbutrin [Bupropion] Itching    Review of Systems   ROS Negative unless otherwise specified per HPI.  Vitals:   Vitals:   05/28/19 1505  BP: 118/72  Pulse: (!) 106  Temp: 98.4 F (36.9 C)  TempSrc: Temporal  SpO2: 96%  Weight: 159 lb 9.6 oz (72.4 kg)  Height: 5\' 1"  (1.549 m)     Body mass index is 30.16 kg/m.   Physical Exam:    Physical Exam Vitals and nursing note reviewed.  Constitutional:      General: She is not in acute distress.    Appearance: She is well-developed. She is not ill-appearing or toxic-appearing.  Cardiovascular:     Rate and Rhythm: Normal rate and regular rhythm.     Pulses: Normal pulses.     Heart sounds: Normal heart sounds, S1 normal and S2 normal.     Comments: No LE edema Pulmonary:     Effort: Pulmonary effort is normal.     Breath sounds: Normal breath sounds.  Skin:    General: Skin is warm and dry.  Neurological:     Mental Status: She is alert.     GCS:  GCS eye subscore is 4. GCS verbal subscore is 5. GCS motor subscore is 6.  Psychiatric:        Speech: Speech normal.        Behavior: Behavior normal. Behavior is cooperative.    Results for orders placed  or performed in visit on 05/28/19  POCT glycosylated hemoglobin (Hb A1C)  Result Value Ref Range   Hemoglobin A1C 5.6 4.0 - 5.6 %   HbA1c POC (<> result, manual entry)     HbA1c, POC (prediabetic range)     HbA1c, POC (controlled diabetic range)        Assessment and Plan:    Averey was seen today for transitions of care.  Diagnoses and all orders for this visit:  Allergy, sequela Doing well with zyrtec, needs refill.   Type 2 diabetes mellitus without complication, without long-term current use of insulin ([facility]) Currently improved -- 5.6% She would like to continue on the Trulicity, will decrease to 0.75 mg weekly. Follow-up in 6 months, sooner if concerns. Orders: -     Microalbumin / creatinine urine ratio -     Discontinue: Dulaglutide (TRULICITY) A999333 0000000 SOPN; Inject 1.5 mg into the skin once a week. -     POCT glycosylated hemoglobin (Hb A1C) -     Comprehensive metabolic panel  Tobacco abuse Start Wellbutrin 150 mg XR daily. Nicotine patches also sent in. Instructed patient as follows: Start the wellbutrin. One week in, set a quit date. On the quit date, start nicotine patches and stop smoking and see how you do. Follow-up with me in 1 week. If any side effects concerning for allergic reaction with the Wellbutrin, immediately stop.  Other orders -     cetirizine (ZYRTEC) 10 MG tablet; Take 1 tablet (10 mg total) by mouth daily. -     Dulaglutide (TRULICITY) A999333 0000000 SOPN; Inject 0.75 mg into the skin once a week. -     buPROPion (WELLBUTRIN XL) 150 MG 24 hr tablet; Take 1 tablet (150 mg total) by mouth daily. -     nicotine (NICODERM CQ - DOSED IN MG/24 HOURS) 21 mg/24hr patch; Place 1 patch (21 mg total) onto the skin daily.    . Reviewed  expectations re: course of current medical issues. . Discussed self-management of symptoms. . Outlined signs and symptoms indicating need for more acute intervention. . Patient verbalized understanding and all questions were answered. . See orders for this visit as documented in the electronic medical record. . Patient received an After Visit Summary.  CMA or LPN served as scribe during this visit. History, Physical, and Plan performed by medical provider. The above documentation has been reviewed and is accurate and complete.  Inda Coke, PA-C [place], [place] 05/28/2019  Follow-up: No follow-ups on file.

## 2019-05-28 NOTE — Patient Instructions (Signed)
It was great to see you!  Start the wellbutrin. One week in, set a quit date. On the quit date, start nicotine patches and stop smoking and see how you do.  Decrease Trulicity to [id number] mg weekly.  Let's follow-up in 1 month to discuss smoking cessation, sooner if you have concerns.  Due to recent changes in healthcare laws, you may see the results of your imaging and laboratory studies on MyChart before your provider has had a chance to review them.  We understand that in some cases there may be results that are confusing or concerning to you. Not all laboratory results come back in the same time frame and the provider may be waiting for multiple results in order to interpret others.  Please give [place] 48 hours in order for your provider to thoroughly review all the results before contacting the office for clarification of your results.   Take care,  Inda Coke PA-C

## 2019-05-29 LAB — COMPREHENSIVE METABOLIC PANEL
ALT: 15 U/L (ref 0–35)
AST: 15 U/L (ref 0–37)
Albumin: 4.3 g/dL (ref 3.5–5.2)
Alkaline Phosphatase: 98 U/L (ref 39–117)
BUN: 10 mg/dL (ref 6–23)
CO2: 30 mEq/L (ref 19–32)
Calcium: 9.6 mg/dL (ref 8.4–10.5)
Chloride: 105 mEq/L (ref 96–112)
Creatinine, Ser: 0.96 mg/dL (ref 0.40–1.20)
GFR: 75.28 mL/min (ref >60.00)
Glucose, Bld: 159 mg/dL — ABNORMAL HIGH (ref 70–99)
Potassium: 4.4 mEq/L (ref 3.5–5.1)
Sodium: 142 mEq/L (ref 135–145)
Total Bilirubin: 0.2 mg/dL (ref 0.2–1.2)
Total Protein: 6.9 g/dL (ref 6.0–8.3)

## 2019-05-29 LAB — MICROALBUMIN / CREATININE URINE RATIO
Creatinine,U: 133.2 mg/dL
Microalb Creat Ratio: 0.5 mg/g (ref 0.0–30.0)
Microalb, Ur: <0.7 mg/dL (ref 0.0–1.9)

## 2019-10-14 ENCOUNTER — Other Ambulatory Visit: Payer: Self-pay | Admitting: Family Medicine

## 2019-10-22 ENCOUNTER — Other Ambulatory Visit: Payer: Self-pay | Admitting: Physician Assistant

## 2019-12-31 ENCOUNTER — Encounter (INDEPENDENT_AMBULATORY_CARE_PROVIDER_SITE_OTHER): Payer: No Typology Code available for payment source | Admitting: Surgical Oncology

## 2019-12-31 DIAGNOSIS — C50912 Malignant neoplasm of unspecified site of left female breast: Secondary | ICD-10-CM

## 2019-12-31 DIAGNOSIS — Z803 Family history of malignant neoplasm of breast: Secondary | ICD-10-CM

## 2019-12-31 DIAGNOSIS — Z170 Estrogen receptor positive status [ER+]: Secondary | ICD-10-CM

## 2020-01-19 ENCOUNTER — Other Ambulatory Visit: Payer: Self-pay

## 2020-01-19 ENCOUNTER — Encounter (HOSPITAL_COMMUNITY): Payer: Self-pay

## 2020-01-19 DIAGNOSIS — M791 Myalgia, unspecified site: Secondary | ICD-10-CM | POA: Diagnosis not present

## 2020-01-19 DIAGNOSIS — Z5321 Procedure and treatment not carried out due to patient leaving prior to being seen by health care provider: Secondary | ICD-10-CM | POA: Diagnosis not present

## 2020-01-19 DIAGNOSIS — R6883 Chills (without fever): Secondary | ICD-10-CM | POA: Diagnosis present

## 2020-01-19 NOTE — ED Notes (Signed)
Called to recheck vitals no answer 

## 2020-01-19 NOTE — ED Triage Notes (Signed)
Pt presents with c/o generalized body aches and chills. Pt is vaccinated against Covid, denies any known contacts.

## 2020-01-20 ENCOUNTER — Other Ambulatory Visit: Payer: Self-pay

## 2020-01-20 ENCOUNTER — Emergency Department (HOSPITAL_COMMUNITY)
Admission: EM | Admit: 2020-01-20 | Discharge: 2020-01-20 | Disposition: A | Discharge status: Home / Self Care | Payer: PRIVATE HEALTH INSURANCE | Attending: Emergency Medicine | Admitting: Emergency Medicine

## 2020-01-20 ENCOUNTER — Ambulatory Visit
Admission: EM | Admit: 2020-01-20 | Discharge: 2020-01-20 | Disposition: A | Discharge status: Home / Self Care | Payer: PRIVATE HEALTH INSURANCE | Attending: Emergency Medicine | Admitting: Emergency Medicine

## 2020-01-20 DIAGNOSIS — R112 Nausea with vomiting, unspecified: Secondary | ICD-10-CM | POA: Diagnosis not present

## 2020-01-20 DIAGNOSIS — J069 Acute upper respiratory infection, unspecified: Secondary | ICD-10-CM | POA: Diagnosis not present

## 2020-01-20 DIAGNOSIS — Z1152 Encounter for screening for COVID-19: Secondary | ICD-10-CM

## 2020-01-20 MED ORDER — BENZONATATE 200 MG PO CAPS: 200.0000 mg | ORAL_CAPSULE | Freq: Three times a day (TID) | ORAL | 0 refills | Status: AC | PRN

## 2020-01-20 MED ORDER — ONDANSETRON 4 MG PO TBDP: 4.0000 mg | ORAL_TABLET | Freq: Three times a day (TID) | ORAL | 0 refills | Status: DC | PRN

## 2020-01-20 MED ORDER — FLUTICASONE PROPIONATE 50 MCG/ACT NA SUSP: 1.0000 | Freq: Every day | NASAL | 0 refills | Status: DC

## 2020-01-20 NOTE — Discharge Instructions (Signed)
Covid test pending, monitor my chart for results Tessalon for cough every 8 hours Flonase for sinus pressure and congestion Zofran for nausea Tylenol and ibuprofen as needed for body aches, headaches, sore throat Rest and drink plenty of fluids Follow-up if not improving or worsening

## 2020-01-20 NOTE — ED Triage Notes (Signed)
Pt states she has had fevers, nasal congestion and drainage, headache, and generalized body aches. Pt states she threw up and had brownish tint to it that she felt was blood due to it being red when it got on the paper. Pt is aox4 and ambulatory.

## 2020-01-21 ENCOUNTER — Telehealth: Payer: Self-pay

## 2020-01-21 LAB — SARS-COV-2, NAA 2 DAY TAT

## 2020-01-21 LAB — NOVEL CORONAVIRUS, NAA: SARS-CoV-2, NAA: DETECTED — AB

## 2020-01-21 NOTE — ED Provider Notes (Signed)
[facility]    CSN: [id number] Arrival date & time: 01/20/20  1357      History   Chief Complaint Chief Complaint  Patient presents with  . Fever    X 2 days hih 102.9  . Cough    X 2 days right side  . Fatigue    X 2 days    HPI Anna Huffman is a 49 y.o. female history of DM type II, presenting today for evaluation of URI symptoms body aches and nausea.  Patient reports that over the past 1 to 2 days she has had subjective fevers and body aches.  She has had some nasal congestion drainage with headaches.  Yesterday had an episode of vomiting that was blood-tinged.  She has continued to have nausea and was concerned about the blood-tinged vomit.  Has not recurred since.  Denies any chest pain or abdominal pain.  HPI  Past Medical History:  Diagnosis Date  . Anemia   . Anxiety   . Blood transfusion without reported diagnosis   . Heart murmur     Patient Active Problem List   Diagnosis Date Noted  . Vitamin D deficiency 10/05/2018  . Type 2 diabetes mellitus without complication, without long-term current use of insulin ([place]) 04/23/2016  . Seasonal allergies 04/23/2016  . Class 1 obesity due to excess calories without serious comorbidity with body mass index (BMI) of 30.0 to 30.9 in adult 03/27/2016  . Smoker 03/27/2016  . Anxiety   . Fibroid, uterine 07/23/2013    Past Surgical History:  Procedure Laterality Date  . BREAST REDUCTION SURGERY      OB History   No obstetric history on file.      Home Medications    Prior to Admission medications   Medication Sig Start Date End Date Taking? Authorizing Provider  cetirizine (ZYRTEC) 10 MG tablet Take 1 tablet (10 mg total) by mouth daily. 05/28/19  Yes Worley, Aldona Bar, PA  TRULICITY [id number] SOPN ADMINISTER 0.75 MG UNDER THE SKIN 1 TIME A WEEK 10/22/19  Yes Inda Coke, PA  benzonatate (TESSALON) 200 MG capsule Take 1 capsule (200 mg total) by mouth 3 (three) times daily as needed for  up to 7 days for cough. 01/20/20 01/27/20  Leonce Bale C, PA-C  fluticasone (FLONASE) 50 MCG/ACT nasal spray Place 1-2 sprays into both nostrils daily. 01/20/20   Zachari Alberta C, PA-C  ondansetron (ZOFRAN ODT) 4 MG disintegrating tablet Take 1 tablet (4 mg total) by mouth every 8 (eight) hours as needed for nausea or vomiting. 01/20/20   Joycelin Radloff C, PA-C  buPROPion (WELLBUTRIN XL) 150 MG 24 hr tablet Take 1 tablet (150 mg total) by mouth daily. 05/28/19 01/20/20  Inda Coke, PA    Family History Family History  Problem Relation Age of Onset  . Stomach cancer Mother   . Prostate cancer Father     Social History Social History   Tobacco Use  . Smoking status: Current Every Day Smoker    Packs/day: 0.50    Types: Cigarettes  . Smokeless tobacco: Never Used  Vaping Use  . Vaping Use: Never used  Substance Use Topics  . Alcohol use: Yes    Comment: social   . Drug use: Yes    Types: Marijuana     Allergies   Codeine, Latex, and Wellbutrin [bupropion]   Review of Systems Review of Systems  Constitutional: Positive for fatigue and fever. Negative for activity change, appetite change and chills.  HENT: Positive for congestion and rhinorrhea. Negative for ear pain, sinus pressure, sore throat and trouble swallowing.   Eyes: Negative for discharge and redness.  Respiratory: Positive for cough. Negative for chest tightness and shortness of breath.   Cardiovascular: Negative for chest pain.  Gastrointestinal: Positive for nausea and vomiting. Negative for abdominal pain and diarrhea.  Musculoskeletal: Negative for myalgias.  Skin: Negative for rash.  Neurological: Negative for dizziness, light-headedness and headaches.     Physical Exam Triage Vital Signs ED Triage Vitals  Enc Vitals Group     BP 01/20/20 1619 (!) 139/106     Pulse Rate 01/20/20 1619 78     Resp 01/20/20 1619 18     Temp 01/20/20 1619 98 F (36.7 C)     Temp Source 01/20/20 1619 Oral      SpO2 01/20/20 1619 98 %     Weight --      Height --      Head Circumference --      Peak Flow --      Pain Score 01/20/20 1621 10     Pain Loc --      Pain Edu? --      Excl. in [place]? --    No data found.  Updated Vital Signs BP (!) 139/106 (BP Location: Left Arm)   Pulse 78   Temp 98 F (36.7 C) (Oral)   Resp 18   SpO2 98%   Visual Acuity Right Eye Distance:   Left Eye Distance:   Bilateral Distance:    Right Eye Near:   Left Eye Near:    Bilateral Near:     Physical Exam Vitals and nursing note reviewed.  Constitutional:      Appearance: She is well-developed and well-nourished.     Comments: No acute distress  HENT:     Head: Normocephalic and atraumatic.     Ears:     Comments: Bilateral ears without tenderness to palpation of external auricle, tragus and mastoid, EAC's without erythema or swelling, TM's with good bony landmarks and cone of light. Non erythematous.     Nose: Nose normal.     Mouth/Throat:     Comments: Oral mucosa pink and moist, no tonsillar enlargement or exudate. Posterior pharynx patent and nonerythematous, no uvula deviation or swelling. Normal phonation.  Eyes:     Conjunctiva/sclera: Conjunctivae normal.  Cardiovascular:     Rate and Rhythm: Normal rate.  Pulmonary:     Effort: Pulmonary effort is normal. No respiratory distress.     Comments: Breathing comfortably at rest, CTABL, no wheezing, rales or other adventitious sounds auscultated  Abdominal:     General: There is no distension.  Musculoskeletal:        General: Normal range of motion.     Cervical back: Neck supple.  Skin:    General: Skin is warm and dry.  Neurological:     Mental Status: She is alert and oriented to person, place, and time.  Psychiatric:        Mood and Affect: Mood and affect normal.      UC Treatments / Results  Labs (all labs ordered are listed, but only abnormal results are displayed) Labs Reviewed  NOVEL CORONAVIRUS, NAA     EKG   Radiology No results found.  Procedures Procedures (including critical care time)  Medications Ordered in UC Medications - No data to display  Initial Impression / Assessment and Plan / UC Course  I have reviewed  the triage vital signs and the nursing notes.  Pertinent labs & imaging results that were available during my care of the patient were reviewed by me and considered in my medical decision making (see chart for details).     URI with cough Covid test pending.  Exam reassuring, vital signs stable.  Recommending symptomatic and supportive care rest and fluids.  Discussed strict return precautions. Patient verbalized understanding and is agreeable with plan.  Final Clinical Impressions(s) / UC Diagnoses   Final diagnoses:  Encounter for screening for COVID-19  Viral URI with cough  Non-intractable vomiting with nausea, unspecified vomiting type     Discharge Instructions     Covid test pending, monitor my chart for results Tessalon for cough every 8 hours Flonase for sinus pressure and congestion Zofran for nausea Tylenol and ibuprofen as needed for body aches, headaches, sore throat Rest and drink plenty of fluids Follow-up if not improving or worsening    ED Prescriptions    Medication Sig Dispense Auth. Provider   ondansetron (ZOFRAN ODT) 4 MG disintegrating tablet Take 1 tablet (4 mg total) by mouth every 8 (eight) hours as needed for nausea or vomiting. 20 tablet Madex Seals C, PA-C   benzonatate (TESSALON) 200 MG capsule Take 1 capsule (200 mg total) by mouth 3 (three) times daily as needed for up to 7 days for cough. 28 capsule Dushawn Pusey C, PA-C   fluticasone (FLONASE) 50 MCG/ACT nasal spray Place 1-2 sprays into both nostrils daily. 16 g Zandon Talton, [place] C, PA-C     PDMP not reviewed this encounter.   Janith Lima, PA-C 01/21/20 1040

## 2020-01-21 NOTE — Telephone Encounter (Signed)
Chief Complaint CHEST PAIN (>=21 years) - pain, pressure, heaviness or tightness Reason for Call Symptomatic / Request for Health Information Initial Comment Caller states she has been experiencing chest pain, weakness and numbness in left arm. Caller states her left finger are tingling as well. Translation No Nurse Assessment Nurse: Mayford Knife, RN, Sharyl Nimrod Date/Time Lamount Cohen Time): 01/21/2020 9:36:08 AM Confirm and document reason for call. If symptomatic, describe symptoms. ---Caller states she has been experiencing constant chest pain, weakness and numbness in left arm since 3 am. Caller states her left finger are tingling as well. Inhale and makes it worse Does the patient have any new or worsening symptoms? ---Yes Will a triage be completed? ---Yes Related visit to physician within the last 2 weeks? ---No Does the PT have any chronic conditions? (i.e. diabetes, asthma, this includes High risk factors for pregnancy, etc.) ---Yes List chronic conditions. ---hypoglycemia Is the patient pregnant or possibly pregnant? (Ask all females between the ages of 72-55) ---No Is this a behavioral health or substance abuse call? ---No Guidelines Guideline Title Affirmed Question Affirmed Notes Nurse Date/Time (Eastern Time) COVID-19 - Diagnosed or Suspected MODERATE difficulty breathing (e.g., speaks in phrases, SOB even at rest, pulse 100-120) Mayford Knife, RN, Sharyl Nimrod 01/21/2020 9:37:48 AM Disp. Time Lamount Cohen Time) Disposition Final User 01/21/2020 9:34:51 AM Send to Urgent Fanny Bien, [place] PLEASE NOTE: All timestamps contained within this report are represented as [place] Standard Time. CONFIDENTIALTY NOTICE: This fax transmission is intended only for the addressee. It contains information that is legally privileged, confidential or otherwise protected from use or disclosure. If you are not the intended recipient, you are strictly prohibited from reviewing, disclosing, copying using  or disseminating any of this information or taking any action in reliance on or regarding this information. If you have received this fax in error, please notify us immediately by telephone so that we can arrange for its return to [place]. Phone: [phone], Toll-Free: [phone], Fax: [phone] Page: 2 of 2 Call Id: [id number] 01/21/2020 9:39:43 AM Go to ED Now Yes Mayford Knife, RN, Lollie Sails Disagree/Comply Comply Caller Understands Yes PreDisposition Call Doctor Care Advice Given Per Guideline GO TO ED NOW: * You need to be seen in the Emergency Department. * Go to the ED at ___________ Hospital. * Leave now. Drive carefully. REASSURANCE AND EDUCATION - GOING TO THE ED OR URGENT CARE CENTER DURING THE COVID-19 PANDEMIC: * If you or your child needs to be seen for an urgent medical problem, do not hesitate to go. * Emergency Departments and urgent care centers are safe places. They are well equipped to protect you against the virus. * For non-urgent conditions, talk to your doctor (or NP/PA) first. YOU SHOULD TELL HEALTHCARE PERSONNEL THAT YOU MIGHT HAVE COVID-19: * Tell the first healthcare worker you meet that you may have COVID-19. * Tell them you have symptoms and have been sent for COVID-19 testing. WEAR A MASK - COVER YOUR MOUTH AND NOSE: * Wear a mask. * If you do not have a mask, then cover your mouth and nose with a disposable tissue (e.g., Kleenex, toilet paper, paper towel) or wash cloth. * Severe difficulty breathing occurs CALL EMS 911 IF: * Lips or face turns blue * Confusion occurs. CARE ADVICE given per COVID-19 - DIAGNOSED OR SUSPECTED (Adult) guideline. Comments User: Sheilah Pigeon, RN Date/Time Lamount Cohen Time): 01/21/2020 9:38:05 AM cough since Christmas morning User: Sheilah Pigeon, RN Date/Time Lamount Cohen Time): 01/21/2020 9:39:31 AM COVID exposure today. Results pendin

## 2020-01-22 ENCOUNTER — Encounter: Payer: Self-pay | Admitting: Unknown Physician Specialty

## 2020-01-22 ENCOUNTER — Telehealth: Payer: Self-pay | Admitting: Unknown Physician Specialty

## 2020-01-22 NOTE — Telephone Encounter (Signed)
Called to Discuss with patient about Covid symptoms and the use of the monoclonal antibody infusion for those with mild to moderate Covid symptoms and at a high risk of hospitalization.     Pt appears to qualify for this infusion due to co-morbid conditions and/or a member of an at-risk group in accordance with the FDA Emergency Use Authorization.    Unable to reach pt   LMOM  Symptom onset: 12/24 Vaccinated: unsure Qualified for Infusion: yes

## 2020-01-22 NOTE — Telephone Encounter (Signed)
Left message on voicemail to call office.  Calling to f/u to see if pt went to ED yesterday as instructed.

## 2020-01-23 ENCOUNTER — Other Ambulatory Visit: Payer: Self-pay | Admitting: Physician Assistant

## 2020-02-20 ENCOUNTER — Encounter (INDEPENDENT_AMBULATORY_CARE_PROVIDER_SITE_OTHER): Payer: No Typology Code available for payment source | Admitting: Surgical Oncology

## 2020-02-20 DIAGNOSIS — C50412 Malignant neoplasm of upper-outer quadrant of left female breast: Secondary | ICD-10-CM

## 2020-02-27 DIAGNOSIS — C50412 Malignant neoplasm of upper-outer quadrant of left female breast: Secondary | ICD-10-CM

## 2020-02-27 DIAGNOSIS — Z170 Estrogen receptor positive status [ER+]: Secondary | ICD-10-CM

## 2020-03-07 ENCOUNTER — Encounter (INDEPENDENT_AMBULATORY_CARE_PROVIDER_SITE_OTHER): Payer: No Typology Code available for payment source | Admitting: Surgical Oncology

## 2020-05-09 ENCOUNTER — Encounter (INDEPENDENT_AMBULATORY_CARE_PROVIDER_SITE_OTHER): Payer: No Typology Code available for payment source | Admitting: Surgical Oncology

## 2020-06-09 ENCOUNTER — Other Ambulatory Visit: Payer: Self-pay | Admitting: Physician Assistant

## 2020-07-15 ENCOUNTER — Other Ambulatory Visit: Payer: Self-pay | Admitting: Family Medicine

## 2020-08-28 ENCOUNTER — Other Ambulatory Visit: Payer: Self-pay | Admitting: Family Medicine

## 2020-09-15 ENCOUNTER — Telehealth: Payer: Self-pay

## 2020-09-15 MED ORDER — TRULICITY 0.75 MG/0.5ML ~~LOC~~ SOAJ: SUBCUTANEOUS | 0 refills | Status: DC

## 2020-09-15 NOTE — Telephone Encounter (Signed)
LAST APPOINTMENT DATE: 05/28/19    NEXT APPOINTMENT DATE: [id number]  MEDICATION:TRULICITY [id number] SOPN  PHARMACY:[facility] UF:[id number] - [place], [place] - [address] AT [place]

## 2020-09-15 NOTE — Telephone Encounter (Signed)
Pt notified Rx sent to pharmacy for Trulicity and to keep appt on 8/31. Pt verbalized understanding.

## 2020-09-17 ENCOUNTER — Encounter: Payer: PRIVATE HEALTH INSURANCE | Admitting: Physician Assistant

## 2020-09-24 ENCOUNTER — Encounter: Payer: Self-pay | Admitting: Physician Assistant

## 2020-09-24 ENCOUNTER — Encounter: Payer: No Typology Code available for payment source | Admitting: Physician Assistant

## 2020-11-05 ENCOUNTER — Encounter (INDEPENDENT_AMBULATORY_CARE_PROVIDER_SITE_OTHER): Payer: No Typology Code available for payment source | Admitting: Surgical Oncology

## 2020-11-05 DIAGNOSIS — Z08 Encounter for follow-up examination after completed treatment for malignant neoplasm: Secondary | ICD-10-CM

## 2020-11-05 DIAGNOSIS — Z923 Personal history of irradiation: Secondary | ICD-10-CM

## 2020-11-05 DIAGNOSIS — Z853 Personal history of malignant neoplasm of breast: Secondary | ICD-10-CM

## 2020-11-05 DIAGNOSIS — N644 Mastodynia: Secondary | ICD-10-CM

## 2020-11-07 ENCOUNTER — Encounter (INDEPENDENT_AMBULATORY_CARE_PROVIDER_SITE_OTHER): Payer: No Typology Code available for payment source | Admitting: Surgical Oncology

## 2021-01-21 NOTE — Progress Notes (Signed)
Subjective:    Anna Huffman is a 50 y.o. female and is here for a comprehensive physical exam.   HPI  Health Maintenance Due  Topic Date Due   COVID-19 Vaccine (1) Never done   OPHTHALMOLOGY EXAM  Never done   Hepatitis C Screening  Never done   COLONOSCOPY (Pts 45-87yrs Insurance coverage will need to be confirmed)  Never done   Pneumococcal Vaccine 69-56 Years old (2 - PCV) 02/28/2018   PAP SMEAR-Modifier  03/27/2019   TETANUS/TDAP  01/26/2020   FOOT EXAM  05/27/2020   URINE MICROALBUMIN  05/27/2020    Acute Concerns: Right arm weakness Anna Huffman reports she has been experiencing some weakness in her right arm that has been onset for several months. States that she initially noticed what felt like a crook forming near her shoulder before it radiated down to her bicep. Since then she has been experiencing occasional tenderness upon palpation to the area and decreased ROM. 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 Anna Huffman believes this to be caused by her covid vaccination which she received in February of 2021. Denies past/recent injury.   Chronic Issues: Diabetes 12 month f/u. Currently she has been non- compliant with trulicity 3.54 mg weekly injection since this October. She found the medication to be very beneficial due to noticing increased energy and the tingling in her toes to be resolved while on this medication.  Blood sugars at home are: not checked. Denies: hypoglycemic or hyperglycemic episodes or symptoms.   Lab Results  Component Value Date   HGBA1C 6.1 (A) 01/22/2021   Tobacco Use Anna Huffman is currently smoking one pack every three days and has not expressed any plans to stop at this moment. She has been smoking since she was 36, about 30 years. Reports she did stop for about 9 years at one point, but eventually picked the habit back up.  At one point she was on Wellbutrin XL 150 mg daily but has since discontinued the medication.   Health Maintenance: Immunizations -- Covid- Due Influenza- Postponed Tdap- Due; 2012 Diabetic Foot Exam: Due;2021 --completed today Diabetic Eye Exam: Due;2021 --counseled Colonoscopy -- Due --agreeable to order today Mammogram -- Due --counseled PAP -- Due;2018 --performed today Bone Density -- N/A Diet -- Eats all food groups Sleep habits -- Normal schedule  Exercise -- Not regularly Weight -- Stable Mood -- Stable Weight history: Wt Readings from Last 10 Encounters:  01/22/21 159 lb 3.2 oz (72.2 kg)  05/28/19 159 lb 9.6 oz (72.4 kg)  03/21/19 157 lb (71.2 kg)  10/12/18 160 lb (72.6 kg)  10/05/18 166 lb 3.2 oz (75.4 kg)  11/28/17 153 lb 4 oz (69.5 kg)  09/13/17 153 lb 6.4 oz (69.6 kg)  02/28/17 166 lb 3.2 oz (75.4 kg)  06/14/16 167 lb (75.8 kg)  04/21/16 169 lb 3.2 oz (76.7 kg)   Body mass index is 30.08 kg/m. No LMP recorded. Patient is postmenopausal. Alcohol use:  reports current alcohol use. Tobacco use:  Tobacco Use: High Risk   Smoking Tobacco Use: Every Day   Smokeless Tobacco Use: Never   Passive Exposure: Not on file     Depression screen [facility] 2/9 01/22/2021  Decreased Interest 0  Down, Depressed, Hopeless 0  PHQ - 2 Score 0  Altered sleeping -  Tired, decreased energy -  Change in appetite -  Feeling bad  or failure about yourself  -  Trouble concentrating -  Moving slowly or fidgety/restless -  Suicidal thoughts -  PHQ-9 Score -  Difficult doing work/chores -     Other providers/specialists: Patient Care Team: Inda Coke, [place] as PCP - General (Physician Assistant)    PMHx, SurgHx, SocialHx, Medications, and Allergies were reviewed in the Visit Navigator and updated as appropriate.   Past Medical History:  Diagnosis Date   Anemia    Anxiety    Blood transfusion without reported diagnosis    Heart murmur      Past Surgical History:   Procedure Laterality Date   BREAST REDUCTION SURGERY       Family History  Problem Relation Age of Onset   Stomach cancer Mother    Prostate cancer Father    Kidney disease Father    Congestive Heart Failure Father    GER disease Brother    Diabetes Maternal Grandmother    Heart attack Maternal Grandfather    Diabetes Paternal Grandmother    Throat cancer Paternal Grandfather     Social History   Tobacco Use   Smoking status: Every Day    Packs/day: 0.25    Types: Cigarettes    Start date: 1992   Smokeless tobacco: Never  Vaping Use   Vaping Use: Never used  Substance Use Topics   Alcohol use: Yes    Comment: social    Drug use: Yes    Types: Marijuana    Review of Systems:   Review of Systems  Constitutional:  Negative for chills, fever, malaise/fatigue and weight loss.  HENT:  Negative for hearing loss, sinus pain and sore throat.   Respiratory:  Negative for cough and hemoptysis.   Cardiovascular:  Negative for chest pain, palpitations, leg swelling and PND.  Gastrointestinal:  Negative for abdominal pain, constipation, diarrhea, heartburn, nausea and vomiting.  Genitourinary:  Negative for dysuria, frequency and urgency.  Musculoskeletal:  Negative for back pain, myalgias and neck pain.  Skin:  Negative for itching and rash.  Neurological:  Negative for dizziness, tingling, seizures and headaches.  Endo/Heme/Allergies:  Negative for polydipsia.  Psychiatric/Behavioral:  Negative for depression. The patient is not nervous/anxious.    Objective:   BP 128/62 (BP Location: Left Arm)    Pulse 70    Temp 98.1 F (36.7 C) (Temporal)    Ht 5\' 1"  (1.549 m)    Wt 159 lb 3.2 oz (72.2 kg)    SpO2 98%    BMI 30.08 kg/m  Body mass index is 30.08 kg/m.   General Appearance:    Alert, cooperative, no distress, appears stated age  Head:    Normocephalic, without obvious abnormality, atraumatic  Eyes:    PERRL, conjunctiva/corneas clear, EOM's intact, fundi     benign, both eyes  Ears:    Normal TM's and external ear canals, both ears  Nose:   Nares normal, septum midline, mucosa normal, no drainage    or sinus tenderness  Throat:   Lips, mucosa, and tongue normal; teeth and gums normal  Neck:   Supple, symmetrical, trachea midline, no adenopathy;    thyroid:  no enlargement/tenderness/nodules; no carotid   bruit or JVD  Back:     Symmetric, no curvature, ROM normal, no CVA tenderness  Lungs:     Clear to auscultation bilaterally, respirations unlabored  Chest Wall:    No tenderness or deformity   Heart:    Regular rate and rhythm, S1 and S2 normal, no  murmur, rub or gallop  Breast Exam:  Deferred  Abdomen:     Soft, non-tender, bowel sounds active all four quadrants,    no masses, no organomegaly  Genitalia:    Normal female without lesion, discharge or tenderness  Extremities:   Decreased ROM in Right Arm, pain with resisted abduction atraumatic, no cyanosis or edema  Pulses:   2+ and symmetric all extremities  Skin:   Skin color, texture, turgor normal, no rashes or lesions  Lymph nodes:   Cervical, supraclavicular, and axillary nodes normal  Neurologic:   CNII-XII intact, normal strength, sensation and reflexes    throughout   Diabetic Foot Exam - Simple   Simple Foot Form Visual Inspection No deformities, no ulcerations, no other skin breakdown bilaterally: Yes Sensation Testing Intact to touch and monofilament testing bilaterally: Yes Pulse Check Posterior Tibialis and Dorsalis pulse intact bilaterally: Yes Comments     Results for orders placed or performed in visit on 01/22/21  POCT HgB A1C  Result Value Ref Range   Hemoglobin A1C 6.1 (A) 4.0 - 5.6 %    Assessment/Plan:   Encounter for general adult medical examination with abnormal findings Today patient counseled on age appropriate routine health concerns for screening and prevention, each reviewed and up to date or declined. Immunizations reviewed and up to date or  declined. Labs ordered and reviewed. Risk factors for depression reviewed and negative. Hearing function and visual acuity are intact. ADLs screened and addressed as needed. Functional ability and level of safety reviewed and appropriate. Education, counseling and referrals performed based on assessed risks today. Patient provided with a copy of personalized plan for preventive services.  Right arm weakness Suspect some sort of rotator cuff pathology Referral to sports medicine for further evaluation and management  Type 2 diabetes mellitus without complication, without long-term current use of insulin (HCC) Point-of-care A1c is 6.1% -this is overall stable for patient Will refill and continue 6.96 mg Trulicity weekly Counseled on eye exam Declined pneumonia vaccine We will update lipid panel and renal function Follow-up in 6 months, sooner if concerns  Pap smear for cervical cancer screening Performed today  Smoker Counseled  Class 1 obesity due to excess calories without serious comorbidity with body mass index (BMI) of 30.0 to 30.9 in adult Encourage ongoing exercise and efforts at healthy eating  Special screening for malignant neoplasms, colon Referral placed today     Patient Counseling: [x]    Nutrition: Stressed importance of moderation in sodium/caffeine intake, saturated fat and cholesterol, caloric balance, sufficient intake of fresh fruits, vegetables, fiber, calcium, iron, and 1 mg of folate supplement per day (for females capable of pregnancy).  [x]    Stressed the importance of regular exercise.   [x]    Substance Abuse: Discussed cessation/primary prevention of tobacco, alcohol, or other drug use; driving or other dangerous activities under the influence; availability of treatment for abuse.   [x]    Injury prevention: Discussed safety belts, safety helmets, smoke detector, smoking near bedding or upholstery.   [x]    Sexuality: Discussed sexually transmitted diseases,  partner selection, use of condoms, avoidance of unintended pregnancy  and contraceptive alternatives.  [x]    Dental health: Discussed importance of regular tooth brushing, flossing, and dental visits.  [x]    Health maintenance and immunizations reviewed. Please refer to Health maintenance section.   I,Havlyn C Ratchford,acting as a scribe for [employer], PA.,have documented all relevant documentation on the behalf of Inda Coke, PA,as directed by  Inda Coke, PA while  in the presence of Inda Coke, [place].  I, Inda Coke, [place], have reviewed all documentation for this visit. The documentation on 01/22/21 for the exam, diagnosis, procedures, and orders are all accurate and complete.   Inda Coke, PA-C [place]

## 2021-01-22 ENCOUNTER — Encounter: Payer: Self-pay | Admitting: Physician Assistant

## 2021-01-22 ENCOUNTER — Ambulatory Visit (INDEPENDENT_AMBULATORY_CARE_PROVIDER_SITE_OTHER): Payer: PRIVATE HEALTH INSURANCE | Admitting: Physician Assistant

## 2021-01-22 ENCOUNTER — Other Ambulatory Visit: Payer: Self-pay

## 2021-01-22 ENCOUNTER — Other Ambulatory Visit (HOSPITAL_COMMUNITY)
Admission: RE | Admit: 2021-01-22 | Discharge: 2021-01-22 | Disposition: A | Discharge status: Home / Self Care | Payer: PRIVATE HEALTH INSURANCE | Source: Ambulatory Visit | Attending: Physician Assistant | Admitting: Physician Assistant

## 2021-01-22 VITALS — BP 128/62 | HR 70 | Temp 98.1°F | Ht 61.0 in | Wt 159.2 lb

## 2021-01-22 DIAGNOSIS — E119 Type 2 diabetes mellitus without complications: Secondary | ICD-10-CM

## 2021-01-22 DIAGNOSIS — Z124 Encounter for screening for malignant neoplasm of cervix: Secondary | ICD-10-CM

## 2021-01-22 DIAGNOSIS — Z0001 Encounter for general adult medical examination with abnormal findings: Secondary | ICD-10-CM | POA: Diagnosis not present

## 2021-01-22 DIAGNOSIS — R29898 Other symptoms and signs involving the musculoskeletal system: Secondary | ICD-10-CM | POA: Diagnosis not present

## 2021-01-22 DIAGNOSIS — F17200 Nicotine dependence, unspecified, uncomplicated: Secondary | ICD-10-CM

## 2021-01-22 DIAGNOSIS — E6609 Other obesity due to excess calories: Secondary | ICD-10-CM

## 2021-01-22 DIAGNOSIS — Z6830 Body mass index (BMI) 30.0-30.9, adult: Secondary | ICD-10-CM

## 2021-01-22 DIAGNOSIS — Z1211 Encounter for screening for malignant neoplasm of colon: Secondary | ICD-10-CM

## 2021-01-22 LAB — POCT GLYCOSYLATED HEMOGLOBIN (HGB A1C): Hemoglobin A1C: 6.1 % — AB (ref 4.0–5.6)

## 2021-01-22 MED ORDER — TRULICITY 0.75 MG/0.5ML ~~LOC~~ SOAJ: SUBCUTANEOUS | 3 refills | Status: DC

## 2021-01-22 NOTE — Patient Instructions (Signed)
It was great to see you!  Referral to sports medicine today for your shoulder  Call the [facility] center of [place] for your mammogram  Please schedule diabetic eye exam  Please make an appointment with the lab on your way out. I would like for you to return for lab work within 1-2 weeks. After midnight on the day of the lab draw, please do not eat anything. You may have water, black coffee, unsweetened tea.  I'm refilling trulicity  Let's follow-up in 6 months, sooner if you have concerns.  Take care,  Inda Coke PA-C

## 2021-01-23 LAB — CYTOLOGY - PAP
Comment: NEGATIVE
Diagnosis: NEGATIVE
High risk HPV: NEGATIVE

## 2021-01-28 NOTE — Progress Notes (Signed)
Anna Huffman D.[place] Phone: [phone]   Assessment and Plan:     1. Right shoulder pain, unspecified chronicity 2. Right arm weakness 3. Tendinitis of right rotator cuff -Chronic, worsening, initial sports medicine visit - Likely rotator cuff tendinitis with subacromial bursitis based on HPI, physical exam, unremarkable x-rays - Patient elects for subacromial CSI.  Tolerated well per note below - May use Tylenol/NSAIDs as needed for pain control - Start HEP.  Handout provided - Start physical therapy.  Referral sent - X-rays obtained in clinic.  My interpretation: No acute fracture or dislocation of shoulder.  No significant cortical changes of glenohumeral joint.  C-spine shows cortical changes at C5-6, otherwise unremarkable x-ray.  Procedure: Subacromial Injection Side: Right  Risks explained and consent was given verbally. The site was cleaned with alcohol prep. A steroid injection was performed from posterior approach using 72mL of 1% lidocaine without epinephrine and 65mL of kenalog 40mg /ml. This was well tolerated and resulted in symptomatic relief.  Needle was removed, hemostasis achieved, and post injection instructions were explained.   Pt was advised to call or return to clinic if these symptoms worsen or fail to improve as anticipated.    Pertinent previous records reviewed include PCP note   Follow Up: 3 weeks for reevaluation   Subjective:   I, Anna Huffman, am serving as a Education administrator for Doctor Glennon Mac  Chief Complaint: right arm weakness  HPI:    01/29/2021  Patient is a 51 year old female complaining of right arm weakness. Patient states Shaine reports she has been experiencing some weakness in her right arm that has been going on since  August 2022. States that she initially noticed what felt like a crook forming near her shoulder before it radiated down to her bicep. Since  then she has been experiencing occasional tenderness upon palpation to the area and decreased ROM. but she does have trouble when dressing or performing any overhead movement. Katilynn believes this to be caused by her covid vaccination which she received in February of 2021. No MOI, has not been taking any NSAID when the arm is warmed up it doesn't hurt still doesn't have ROM but not pain   Relevant Historical Information: DM type II  Additional pertinent review of systems negative.   Current Outpatient Medications:    cetirizine (ZYRTEC) 10 MG tablet, Take 1 tablet (10 mg total) by mouth daily., Disp: 30 tablet, Rfl: 0   Dulaglutide (TRULICITY) 2.13 YQ/6.5HQ SOPN, ADMINISTER 0.75 MG UNDER THE SKIN 1 TIME A WEEK, Disp: 2 mL, Rfl: 3   fluticasone (FLONASE) 50 MCG/ACT nasal spray, Place 1-2 sprays into both nostrils daily., Disp: 16 g, Rfl: 0   ondansetron (ZOFRAN ODT) 4 MG disintegrating tablet, Take 1 tablet (4 mg total) by mouth every 8 (eight) hours as needed for nausea or vomiting., Disp: 20 tablet, Rfl: 0   Objective:     Vitals:   01/29/21 1256  BP: 132/80  Pulse: 81  SpO2: 97%  Weight: 161 lb (73 kg)  Height: 5\' 1"  (1.549 m)      Body mass index is 30.42 kg/m.    Physical Exam:    Gen: Appears well, nad, nontoxic and pleasant Neuro:sensation intact, strength is 5/5 with df/pf/inv/ev, muscle tone wnl Skin: no suspicious lesion or defmority Psych: A&O, appropriate mood and affect  Right shoulder: no deformity, swelling or muscle wasting No scapular winging FF 160, abd  150, int 10, ext 70 TTP trapezius, deltoid NTTP over the [place], clavicle, ac, coracoid, biceps groove, humerus,  cervical spine Positive neer, hawkings, empty can, subscap liftoff, speeds, obriens, crossarm Neg ant drawer, sulcus sign, apprehension Negative Spurling's test bilat FROM of neck    Electronically signed by:  Anna Huffman D.[facility] 1:27 PM 01/29/21

## 2021-01-29 ENCOUNTER — Other Ambulatory Visit: Payer: Self-pay

## 2021-01-29 ENCOUNTER — Other Ambulatory Visit (INDEPENDENT_AMBULATORY_CARE_PROVIDER_SITE_OTHER): Payer: PRIVATE HEALTH INSURANCE

## 2021-01-29 ENCOUNTER — Ambulatory Visit (INDEPENDENT_AMBULATORY_CARE_PROVIDER_SITE_OTHER): Payer: PRIVATE HEALTH INSURANCE

## 2021-01-29 ENCOUNTER — Ambulatory Visit (INDEPENDENT_AMBULATORY_CARE_PROVIDER_SITE_OTHER): Payer: PRIVATE HEALTH INSURANCE | Admitting: Sports Medicine

## 2021-01-29 ENCOUNTER — Telehealth: Payer: Self-pay

## 2021-01-29 VITALS — BP 132/80 | HR 81 | Ht 61.0 in | Wt 161.0 lb

## 2021-01-29 DIAGNOSIS — E119 Type 2 diabetes mellitus without complications: Secondary | ICD-10-CM | POA: Diagnosis not present

## 2021-01-29 DIAGNOSIS — M25511 Pain in right shoulder: Secondary | ICD-10-CM

## 2021-01-29 DIAGNOSIS — R29898 Other symptoms and signs involving the musculoskeletal system: Secondary | ICD-10-CM | POA: Diagnosis not present

## 2021-01-29 DIAGNOSIS — M7581 Other shoulder lesions, right shoulder: Secondary | ICD-10-CM

## 2021-01-29 LAB — LIPID PANEL
Cholesterol: 135 mg/dL (ref 0–200)
HDL: 42.5 mg/dL (ref >39.00)
LDL Cholesterol: 70 mg/dL (ref 0–99)
NonHDL: 92.94
Total CHOL/HDL Ratio: 3
Triglycerides: 115 mg/dL (ref 0.0–149.0)
VLDL: 23 mg/dL (ref 0.0–40.0)

## 2021-01-29 LAB — COMPREHENSIVE METABOLIC PANEL
ALT: 17 U/L (ref 0–35)
AST: 14 U/L (ref 0–37)
Albumin: 4 g/dL (ref 3.5–5.2)
Alkaline Phosphatase: 75 U/L (ref 39–117)
BUN: 8 mg/dL (ref 6–23)
CO2: 27 mEq/L (ref 19–32)
Calcium: 9.1 mg/dL (ref 8.4–10.5)
Chloride: 106 mEq/L (ref 96–112)
Creatinine, Ser: 0.75 mg/dL (ref 0.40–1.20)
GFR: 93.77 mL/min (ref >60.00)
Glucose, Bld: 113 mg/dL — ABNORMAL HIGH (ref 70–99)
Potassium: 4.2 mEq/L (ref 3.5–5.1)
Sodium: 139 mEq/L (ref 135–145)
Total Bilirubin: 0.3 mg/dL (ref 0.2–1.2)
Total Protein: 6.7 g/dL (ref 6.0–8.3)

## 2021-01-29 LAB — CBC WITH DIFFERENTIAL/PLATELET
Basophils Absolute: 0.1 10*3/uL (ref 0.0–0.1)
Basophils Relative: 1.2 % (ref 0.0–3.0)
Eosinophils Absolute: 0.4 10*3/uL (ref 0.0–0.7)
Eosinophils Relative: 3.2 % (ref 0.0–5.0)
HCT: 41.6 % (ref 36.0–46.0)
Hemoglobin: 13.3 g/dL (ref 12.0–15.0)
Lymphocytes Relative: 43.4 % (ref 12.0–46.0)
Lymphs Abs: 5.5 10*3/uL — ABNORMAL HIGH (ref 0.7–4.0)
MCHC: 31.9 g/dL (ref 30.0–36.0)
MCV: 78.4 fl (ref 78.0–100.0)
Monocytes Absolute: 0.7 10*3/uL (ref 0.1–1.0)
Monocytes Relative: 5.3 % (ref 3.0–12.0)
Neutro Abs: 5.9 10*3/uL (ref 1.4–7.7)
Neutrophils Relative %: 46.9 % (ref 43.0–77.0)
Platelets: 388 10*3/uL (ref 150.0–400.0)
RBC: 5.31 Mil/uL — ABNORMAL HIGH (ref 3.87–5.11)
RDW: 15.4 % (ref 11.5–15.5)
WBC: 12.6 10*3/uL — ABNORMAL HIGH (ref 4.0–10.5)

## 2021-01-29 LAB — MICROALBUMIN / CREATININE URINE RATIO
Creatinine,U: 47.1 mg/dL
Microalb Creat Ratio: 1.5 mg/g (ref 0.0–30.0)
Microalb, Ur: <0.7 mg/dL (ref 0.0–1.9)

## 2021-01-29 NOTE — Telephone Encounter (Signed)
Patient called in stated she needs a prior auth done for her Dulaglutide (TRULICITY) 4.70 LK/9.5FM SOPN.    Patient would like a call once this has been completed. Patient states she is out of her medication.

## 2021-01-29 NOTE — Patient Instructions (Addendum)
Good to see you Rotator cuff HEP  Physical therapy referral sent  Tylenol , NSAIDS as needed for pain control 3 week follow up

## 2021-01-30 ENCOUNTER — Telehealth: Payer: Self-pay | Admitting: *Deleted

## 2021-01-30 NOTE — Telephone Encounter (Signed)
Approvedtoday Request Reference Number: [id number]. TRULICITY INJ 9.97/7.4 is approved through 01/30/2022. Your patient may now fill this prescription and it will be covered. Patient notified

## 2021-01-30 NOTE — Telephone Encounter (Signed)
Key: [place] - [place] Case ID: [id number] - Rx #: [id number] Need help? Call us at [phone] Status Sent to [employer] Waiting for determination

## 2021-01-30 NOTE — Telephone Encounter (Signed)
Pt was notified by Junious Dresser that Trulicity was approved and to contact pharmacy.

## 2021-02-05 ENCOUNTER — Telehealth: Payer: Self-pay | Admitting: Physician Assistant

## 2021-02-05 NOTE — Telephone Encounter (Signed)
See result notes. Pt called

## 2021-02-05 NOTE — Telephone Encounter (Signed)
Pharmacist, community at [place] Client Site [place] [facility] [place] [facility] Provider [name] Contact Type Call Who Is Calling Patient / Member / Family / Caregiver Caller Name [place] Phone Number [phone] Call Type Message Only Information Provided Reason for Call Returning a Call from the Office Initial [place] states she missed a call from the office. Additional Comment Office hours provided. Caller wanted to send a message. Disp. Time Disposition Final User 02/04/2021 5:11:29 PM General Information Provided Yes Anna Huffman Call Closed By: Anna Huffman Transaction Date/Time: 02/04/2021 5:10:15 PM (ET)

## 2021-02-18 NOTE — Progress Notes (Deleted)
° °   Benito Mccreedy D.[place] Phone: [phone]   Assessment and Plan:     There are no diagnoses linked to this encounter.  ***   Pertinent previous records reviewed include ***   Follow Up: ***     Subjective:   I, Marinus Eicher, am serving as a Education administrator for Doctor Glennon Mac  Chief Complaint: right arm weakness   HPI:  01/29/2021  Patient is a 51 year old female complaining of right arm weakness. Patient states Maurene reports she has been experiencing some weakness in her right arm that has been going on since  August 2022. States that she initially noticed what felt like a crook forming near her shoulder before it radiated down to her bicep. Since then she has been experiencing occasional tenderness upon palpation to the area and decreased ROM. but she does have trouble when dressing or performing any overhead movement. Kerryn believes this to be caused by her covid vaccination which she received in February of 2021. No MOI, has not been taking any NSAID when the arm is warmed up it doesn't hurt still doesn't have ROM but not pain    02/19/2021 Patient states    Relevant Historical Information: DM type II    Additional pertinent review of systems negative.   Current Outpatient Medications:    cetirizine (ZYRTEC) 10 MG tablet, Take 1 tablet (10 mg total) by mouth daily., Disp: 30 tablet, Rfl: 0   Dulaglutide (TRULICITY) 4.81 EH/6.3JS SOPN, ADMINISTER 0.75 MG UNDER THE SKIN 1 TIME A WEEK, Disp: 2 mL, Rfl: 3   fluticasone (FLONASE) 50 MCG/ACT nasal spray, Place 1-2 sprays into both nostrils daily., Disp: 16 g, Rfl: 0   ondansetron (ZOFRAN ODT) 4 MG disintegrating tablet, Take 1 tablet (4 mg total) by mouth every 8 (eight) hours as needed for nausea or vomiting., Disp: 20 tablet, Rfl: 0   Objective:     There were no vitals filed for this visit.    There is no height or weight on file to  calculate BMI.    Physical Exam:    ***   Electronically signed by:  Benito Mccreedy D.[facility] 3:31 PM 02/18/21

## 2021-02-19 ENCOUNTER — Ambulatory Visit: Payer: PRIVATE HEALTH INSURANCE | Admitting: Sports Medicine

## 2021-04-27 ENCOUNTER — Encounter (INDEPENDENT_AMBULATORY_CARE_PROVIDER_SITE_OTHER): Payer: Self-pay | Admitting: Surgical Oncology

## 2021-04-27 ENCOUNTER — Encounter (INDEPENDENT_AMBULATORY_CARE_PROVIDER_SITE_OTHER): Payer: No Typology Code available for payment source | Admitting: Surgical Oncology

## 2021-04-29 NOTE — Progress Notes (Unsigned)
CLINIC:      REPORT TYPE:  NOTE    Dictating Practioner:  Meta Hatchet, MD    Staff Physician:  Meta Hatchet, MD    DATE OF SERVICE:  04/27/2021    REASON FOR VISIT:      The patient is a 51 year old female, who returns here for followup of  a *** carcinoma of the left breast status post left partial  mastectomy with sentinel node and oncoplastic reduction mammoplasty  of the left as well as right breast.  The patient is doing well and  has no complaints.  The patient is presently on tamoxifen and will  transition to an aromatase inhibitor.  The patient has no complaints.     PHYSICAL EXAMINATION:    GENERAL:  The patient presents as a well-developed female in no  distress.   VITAL SIGNS:  Pulse is 80 and regular, temperature afebrile, weight  179 pounds.   HEENT:  Sclerae nonicteric.   NECK:  Supple without  *** or bruits.   CHEST AND LUNGS:  Clear and resonant to percussion and auscultation.   BREASTS:  Pendulous with reduction mammoplasty scars bilaterally.  Bimanual examination reveals no dominant masses in the right or left  breast.  There is no axillary lymphadenopathy.   ABDOMEN:  Soft without organomegaly or masses.  Bowel sounds are  active.     RECOMMENDATIONS:  Return here in 6 months.  Continue diagnostic  mammograms yearly.       Job #:  [id number]      DD:  04/29/2021  DT:  04/29/2021 09:44:13  RMB/MODL          [id number]    Referring Physician:

## 2021-05-26 ENCOUNTER — Other Ambulatory Visit: Payer: Self-pay | Admitting: Physician Assistant

## 2021-08-10 ENCOUNTER — Telehealth: Payer: Self-pay | Admitting: Physician Assistant

## 2021-08-10 NOTE — Telephone Encounter (Signed)
We can not prescribe anything with out patient being seen and evaluated. Please schedule an appt.

## 2021-08-10 NOTE — Telephone Encounter (Signed)
Patient has been experiencing tooth ache, she cannot get into a dentist soon enough - she wants to know if there is a mouth wash or anything she can be prescribed for pain?

## 2021-08-12 NOTE — Telephone Encounter (Signed)
Pt seeing hudnell 7/20 at 4 pm

## 2021-08-13 ENCOUNTER — Ambulatory Visit: Payer: PRIVATE HEALTH INSURANCE | Admitting: Family

## 2021-10-09 ENCOUNTER — Other Ambulatory Visit: Payer: Self-pay | Admitting: *Deleted

## 2021-10-09 MED ORDER — TRULICITY 0.75 MG/0.5ML ~~LOC~~ SOAJ: SUBCUTANEOUS | 0 refills | Status: DC

## 2021-10-19 ENCOUNTER — Encounter: Payer: Self-pay | Admitting: *Deleted

## 2021-11-13 ENCOUNTER — Encounter (INDEPENDENT_AMBULATORY_CARE_PROVIDER_SITE_OTHER): Payer: Self-pay | Admitting: Surgical Oncology

## 2021-11-13 ENCOUNTER — Encounter (INDEPENDENT_AMBULATORY_CARE_PROVIDER_SITE_OTHER): Payer: No Typology Code available for payment source | Admitting: Surgical Oncology

## 2021-11-13 NOTE — Progress Notes (Signed)
CLINIC:      REPORT TYPE:  NOTE    Dictating Practioner:  Meta Hatchet, MD    Staff Physician:  Meta Hatchet, MD    DATE OF SERVICE:  11/13/2021    REASON FOR VISIT:      Patient returns for followup of invasive lobular carcinoma of the  left breast, status post generous quadrantectomy with an oncoplasty  reduction mammoplasty with reduction mammoplasty on the right.  Patient is doing well.  She just had her mammogram, which was  negative.  Patient has no other complaints.  She continues on  tamoxifen.  The patient has completed her radiation therapy to the  left breast.     PHYSICAL EXAMINATION:    Exam today reveals no supraclavicular or cervical lymphadenopathy.     Chest and Lungs:  Clear and resonant to percussion and auscultation.   Breasts:  Reveal reduction mammoplasty scars bilaterally.  Bimanual  examination reveals no dominant masses in the right or left breast.  There is no axillary lymphadenopathy.   Abdomen:  Soft without organomegaly or masses.  Bowel sounds are  active.     RECOMMENDATION:  Continue diagnostic mammograms yearly.  Continue  tamoxifen.  Return here in 1 year.       Job #:  [id number]    DD:  11/13/2021  DT:  11/13/2021 15:47:48  RMB/MODL          [id number]    Referring Physician:      cc:       Demaris Callander

## 2022-01-07 ENCOUNTER — Encounter: Payer: Self-pay | Admitting: *Deleted

## 2022-03-15 ENCOUNTER — Ambulatory Visit (INDEPENDENT_AMBULATORY_CARE_PROVIDER_SITE_OTHER): Payer: PRIVATE HEALTH INSURANCE | Admitting: Physician Assistant

## 2022-03-15 ENCOUNTER — Encounter: Payer: Self-pay | Admitting: Physician Assistant

## 2022-03-15 VITALS — BP 110/70 | HR 79 | Temp 97.5°F | Ht 63.0 in | Wt 146.5 lb

## 2022-03-15 DIAGNOSIS — F17210 Nicotine dependence, cigarettes, uncomplicated: Secondary | ICD-10-CM

## 2022-03-15 DIAGNOSIS — E119 Type 2 diabetes mellitus without complications: Secondary | ICD-10-CM

## 2022-03-15 DIAGNOSIS — Z23 Encounter for immunization: Secondary | ICD-10-CM

## 2022-03-15 DIAGNOSIS — Z1211 Encounter for screening for malignant neoplasm of colon: Secondary | ICD-10-CM

## 2022-03-15 DIAGNOSIS — Z0000 Encounter for general adult medical examination without abnormal findings: Secondary | ICD-10-CM

## 2022-03-15 DIAGNOSIS — E559 Vitamin D deficiency, unspecified: Secondary | ICD-10-CM | POA: Diagnosis not present

## 2022-03-15 DIAGNOSIS — F17200 Nicotine dependence, unspecified, uncomplicated: Secondary | ICD-10-CM

## 2022-03-15 LAB — POCT GLYCOSYLATED HEMOGLOBIN (HGB A1C): Hemoglobin A1C: 6.1 % — AB (ref 4.0–5.6)

## 2022-03-15 NOTE — Patient Instructions (Addendum)
It was great to see you!  I will place order for colonoscopy and lung cancer screening Please schedule a mammogram!  Please go to the lab for blood work.   Our office will call you with your results unless you have chosen to receive results via MyChart.  If your blood work is normal we will follow-up each year for physicals and as scheduled for chronic medical problems.  If anything is abnormal we will treat accordingly and get you in for a follow-up.  Take care,  Aldona Bar

## 2022-03-15 NOTE — Progress Notes (Signed)
Subjective:    Anna Huffman is a 52 y.o. female and is here for a comprehensive physical exam.  HPI  Health Maintenance Due  Topic Date Due   OPHTHALMOLOGY EXAM  Never done   Hepatitis C Screening  Never done   COLONOSCOPY (Pts 45-15yr Insurance coverage will need to be confirmed)  Never done   DTaP/Tdap/Td (3 - Td or Tdap) 01/26/2020   FOOT EXAM  05/27/2020   HEMOGLOBIN A1C  07/23/2021   Diabetic kidney evaluation - eGFR measurement  01/29/2022   Diabetic kidney evaluation - Urine ACR  01/29/2022   MAMMOGRAM  02/03/2022   Zoster Vaccines- Shingrix (1 of 2) Never done    Acute Concerns: None  Chronic Issues: Tobacco abuse  Currently smoking 1 pack per day Chantix did not help  Diabetes >12 month follow-up. Current DM meds: none -- was on Trulicity [id number] weekly but caused severe appetite reduction and she experienced weight loss. Blood sugars at home are: not regularly checked. Patient is compliant with medications. Denies: hypoglycemic or hyperglycemic episodes or symptoms.  Lab Results  Component Value Date   HGBA1C 6.1 (A) 03/15/2022   Vit D deficiency Does not regularly take Vit D   Health Maintenance: Immunizations -- Tdap updated today; will check with her insurance for shingrix Colonoscopy -- Referral placed Mammogram -- Discussed need -- handout provided PAP -- UTD Diet -- working on healthy diet as able Exercise -- as able, no scheduled exercise  Sleep habits -- no major concerns Mood -- stable  UTD with dentist? - yes UTD with eye doctor? - yes  Weight history: Wt Readings from Last 10 Encounters:  03/15/22 146 lb 8 oz (66.5 kg)  01/29/21 161 lb (73 kg)  01/22/21 159 lb 3.2 oz (72.2 kg)  05/28/19 159 lb 9.6 oz (72.4 kg)  03/21/19 157 lb (71.2 kg)  10/12/18 160 lb (72.6 kg)  10/05/18 166 lb 3.2 oz (75.4 kg)  11/28/17 153 lb 4 oz (69.5 kg)  09/13/17 153 lb 6.4 oz (69.6 kg)  02/28/17 166 lb 3.2 oz (75.4 kg)   Body mass index is 25.95  kg/m. No LMP recorded. Patient is postmenopausal.  Alcohol use:  reports current alcohol use.  Tobacco use:  Tobacco Use: High Risk (03/15/2022)   Patient History    Smoking Tobacco Use: Every Day    Smokeless Tobacco Use: Never    Passive Exposure: Not on file   Eligible for lung cancer screening? yes     03/15/2022    1:39 PM  Depression screen PHQ 2/9  Decreased Interest 0  Down, Depressed, Hopeless 0  PHQ - 2 Score 0     Other providers/specialists: Patient Care Team: WInda Coke PUtahas PCP - General (Physician Assistant)    PMHx, SurgHx, SocialHx, Medications, and Allergies were reviewed in the Visit Navigator and updated as appropriate.   Past Medical History:  Diagnosis Date   Anemia    Anxiety    Blood transfusion without reported diagnosis    Heart murmur      Past Surgical History:  Procedure Laterality Date   BREAST REDUCTION SURGERY       Family History  Problem Relation Age of Onset   Stomach cancer Mother    Prostate cancer Father    Kidney disease Father    Congestive Heart Failure Father    GER disease Brother    Diabetes Maternal Grandmother    Heart attack Maternal Grandfather    Diabetes Paternal  Grandmother    Throat cancer Paternal Grandfather     Social History   Tobacco Use   Smoking status: Every Day    Packs/day: 0.25    Types: Cigarettes    Start date: 1992   Smokeless tobacco: Never  Vaping Use   Vaping Use: Never used  Substance Use Topics   Alcohol use: Yes    Comment: social    Drug use: Yes    Types: Marijuana    Review of Systems:   Review of Systems  Constitutional:  Negative for chills, fever, malaise/fatigue and weight loss.  HENT:  Negative for hearing loss, sinus pain and sore throat.   Respiratory:  Negative for cough and hemoptysis.   Cardiovascular:  Negative for chest pain, palpitations, leg swelling and PND.  Gastrointestinal:  Negative for abdominal pain, constipation, diarrhea,  heartburn, nausea and vomiting.  Genitourinary:  Negative for dysuria, frequency and urgency.  Musculoskeletal:  Negative for back pain, myalgias and neck pain.  Skin:  Negative for itching and rash.  Neurological:  Negative for dizziness, tingling, seizures and headaches.  Endo/Heme/Allergies:  Negative for polydipsia.  Psychiatric/Behavioral:  Negative for depression. The patient is not nervous/anxious.     Objective:   BP 110/70 (BP Location: Left Arm, Patient Position: Sitting, Cuff Size: Normal)   Pulse 79   Temp (!) 97.5 F (36.4 C) (Temporal)   Ht 5' 3"$  (1.6 m)   Wt 146 lb 8 oz (66.5 kg)   SpO2 95%   BMI 25.95 kg/m  Body mass index is 25.95 kg/m.   General Appearance:    Alert, cooperative, no distress, appears stated age  Head:    Normocephalic, without obvious abnormality, atraumatic  Eyes:    PERRL, conjunctiva/corneas clear, EOM's intact, fundi    benign, both eyes  Ears:    Normal TM's and external ear canals, both ears  Nose:   Nares normal, septum midline, mucosa normal, no drainage    or sinus tenderness  Throat:   Lips, mucosa, and tongue normal; teeth and gums normal  Neck:   Supple, symmetrical, trachea midline, no adenopathy;    thyroid:  no enlargement/tenderness/nodules; no carotid   bruit or JVD  Back:     Symmetric, no curvature, ROM normal, no CVA tenderness  Lungs:     Clear to auscultation bilaterally, respirations unlabored  Chest Wall:    No tenderness or deformity   Heart:    Regular rate and rhythm, S1 and S2 normal, no murmur, rub or gallop  Breast Exam:    Deferred  Abdomen:     Soft, non-tender, bowel sounds active all four quadrants,    no masses, no organomegaly  Genitalia:    Deferred  Extremities:   Extremities normal, atraumatic, no cyanosis or edema  Pulses:   2+ and symmetric all extremities  Skin:   Skin color, texture, turgor normal, no rashes or lesions  Lymph nodes:   Cervical, supraclavicular, and axillary nodes normal   Neurologic:   CNII-XII intact, normal strength, sensation and reflexes    throughout   Results for orders placed or performed in visit on 03/15/22  POCT glycosylated hemoglobin (Hb A1C)  Result Value Ref Range   Hemoglobin A1C 6.1 (A) 4.0 - 5.6 %    Assessment/Plan:   Routine physical examination Today patient counseled on age appropriate routine health concerns for screening and prevention, each reviewed and up to date or declined. Immunizations reviewed and up to date or declined. Labs ordered and  reviewed. Risk factors for depression reviewed and negative. Hearing function and visual acuity are intact. ADLs screened and addressed as needed. Functional ability and level of safety reviewed and appropriate. Education, counseling and referrals performed based on assessed risks today. Patient provided with a copy of personalized plan for preventive services.  Type 2 diabetes mellitus without complication, without long-term current use of insulin (HCC) A1c is stable She wants to avoid medications as most of them cause poor appetite She is planning to get a glucometer to monitor her sx  Vitamin D deficiency Update Vit D and make recommendations accordingly  Smoker; Smokes with greater than 30 pack year history Lung cancer CT ordered Not ready to quit at this time  Special screening for malignant neoplasms, colon Referral placed  Need for prophylactic vaccination with combined diphtheria-tetanus-pertussis (DTP) vaccine Updated today  Inda Coke, PA-C [place]

## 2022-03-16 LAB — CBC WITH DIFFERENTIAL/PLATELET
Basophils Absolute: 0.1 10*3/uL (ref 0.0–0.1)
Basophils Relative: 1.1 % (ref 0.0–3.0)
Eosinophils Absolute: 0.3 10*3/uL (ref 0.0–0.7)
Eosinophils Relative: 2.2 % (ref 0.0–5.0)
HCT: 41.6 % (ref 36.0–46.0)
Hemoglobin: 13.5 g/dL (ref 12.0–15.0)
Lymphocytes Relative: 42.8 % (ref 12.0–46.0)
Lymphs Abs: 5.4 10*3/uL — ABNORMAL HIGH (ref 0.7–4.0)
MCHC: 32.6 g/dL (ref 30.0–36.0)
MCV: 77.2 fl — ABNORMAL LOW (ref 78.0–100.0)
Monocytes Absolute: 0.7 10*3/uL (ref 0.1–1.0)
Monocytes Relative: 5.7 % (ref 3.0–12.0)
Neutro Abs: 6.1 10*3/uL (ref 1.4–7.7)
Neutrophils Relative %: 48.2 % (ref 43.0–77.0)
Platelets: 406 10*3/uL — ABNORMAL HIGH (ref 150.0–400.0)
RBC: 5.38 Mil/uL — ABNORMAL HIGH (ref 3.87–5.11)
RDW: 15.9 % — ABNORMAL HIGH (ref 11.5–15.5)
WBC: 12.6 10*3/uL — ABNORMAL HIGH (ref 4.0–10.5)

## 2022-03-16 LAB — COMPREHENSIVE METABOLIC PANEL
ALT: 10 U/L (ref 0–35)
AST: 13 U/L (ref 0–37)
Albumin: 4.1 g/dL (ref 3.5–5.2)
Alkaline Phosphatase: 83 U/L (ref 39–117)
BUN: 11 mg/dL (ref 6–23)
CO2: 24 mEq/L (ref 19–32)
Calcium: 9.7 mg/dL (ref 8.4–10.5)
Chloride: 107 mEq/L (ref 96–112)
Creatinine, Ser: 0.74 mg/dL (ref 0.40–1.20)
GFR: 94.54 mL/min (ref >60.00)
Glucose, Bld: 104 mg/dL — ABNORMAL HIGH (ref 70–99)
Potassium: 4 mEq/L (ref 3.5–5.1)
Sodium: 140 mEq/L (ref 135–145)
Total Bilirubin: 0.2 mg/dL (ref 0.2–1.2)
Total Protein: 6.7 g/dL (ref 6.0–8.3)

## 2022-03-16 LAB — LIPID PANEL
Cholesterol: 143 mg/dL (ref 0–200)
HDL: 49.4 mg/dL (ref >39.00)
LDL Cholesterol: 65 mg/dL (ref 0–99)
NonHDL: 93.51
Total CHOL/HDL Ratio: 3
Triglycerides: 141 mg/dL (ref 0.0–149.0)
VLDL: 28.2 mg/dL (ref 0.0–40.0)

## 2022-03-16 LAB — MICROALBUMIN / CREATININE URINE RATIO
Creatinine,U: 173.7 mg/dL
Microalb Creat Ratio: 0.4 mg/g (ref 0.0–30.0)
Microalb, Ur: 0.8 mg/dL (ref 0.0–1.9)

## 2022-03-16 LAB — VITAMIN B12: Vitamin B-12: 138 pg/mL — ABNORMAL LOW (ref 211–911)

## 2022-03-16 LAB — VITAMIN D 25 HYDROXY (VIT D DEFICIENCY, FRACTURES): VITD: <7 ng/mL — ABNORMAL LOW (ref 30.00–100.00)

## 2022-03-17 ENCOUNTER — Other Ambulatory Visit: Payer: Self-pay | Admitting: Physician Assistant

## 2022-03-17 MED ORDER — VITAMIN D (ERGOCALCIFEROL) 1.25 MG (50000 UNIT) PO CAPS: 50000.0000 [IU] | ORAL_CAPSULE | ORAL | 0 refills | Status: AC

## 2022-03-18 ENCOUNTER — Telehealth: Payer: Self-pay | Admitting: *Deleted

## 2022-03-18 NOTE — Telephone Encounter (Signed)
Anna Huffman, pt said since her visit on 2/19 her sugars have been running high and she would like to go back on Trulicity. Please advise.

## 2022-03-22 MED ORDER — TRULICITY 0.75 MG/0.5ML ~~LOC~~ SOAJ: 0.7500 mg | SUBCUTANEOUS | 2 refills | Status: DC

## 2022-03-22 NOTE — Telephone Encounter (Signed)
Spoke to pt told her Rx for Trulicity was sent to pharmacy. Pt verbalized understanding.

## 2022-03-25 ENCOUNTER — Other Ambulatory Visit: Payer: Self-pay | Admitting: Physician Assistant

## 2022-03-25 ENCOUNTER — Telehealth: Payer: Self-pay | Admitting: *Deleted

## 2022-03-25 MED ORDER — EMPAGLIFLOZIN 10 MG PO TABS: 10.0000 mg | ORAL_TABLET | Freq: Every day | ORAL | 0 refills | Status: DC

## 2022-03-25 MED ORDER — EMPAGLIFLOZIN 10 MG PO TABS: 10.0000 mg | ORAL_TABLET | Freq: Every day | ORAL | 0 refills | Status: AC

## 2022-03-25 NOTE — Progress Notes (Signed)
Called [facility] and spoke to [place], verbal order given for Jardiance 10 mg tablet, disp # 30, refill 0. Merrilee Seashore verbalized understanding.

## 2022-03-25 NOTE — Addendum Note (Signed)
Addended by: Marian Sorrow on: 03/25/2022 12:32 PM   Modules accepted: Orders

## 2022-03-25 NOTE — Telephone Encounter (Signed)
Left message on voicemail to call office.  

## 2022-03-29 NOTE — Telephone Encounter (Signed)
Spoke to pt told her Rx for Jardiance 10 mg was called into pharmacy for her due to insurance would not cover Trulicity. Pt verbalized understanding and will pick up today.

## 2022-07-05 ENCOUNTER — Other Ambulatory Visit: Payer: Self-pay | Admitting: Emergency Medicine

## 2022-07-05 DIAGNOSIS — Z87891 Personal history of nicotine dependence: Secondary | ICD-10-CM

## 2022-07-05 DIAGNOSIS — Z122 Encounter for screening for malignant neoplasm of respiratory organs: Secondary | ICD-10-CM

## 2022-07-05 DIAGNOSIS — F17210 Nicotine dependence, cigarettes, uncomplicated: Secondary | ICD-10-CM

## 2022-08-11 ENCOUNTER — Ambulatory Visit: Payer: PRIVATE HEALTH INSURANCE

## 2022-08-11 ENCOUNTER — Encounter: Payer: PRIVATE HEALTH INSURANCE | Admitting: Acute Care

## 2022-08-11 ENCOUNTER — Telehealth: Payer: Self-pay | Admitting: Acute Care

## 2022-08-11 NOTE — Telephone Encounter (Signed)
I have called an additional 2 times to try to get in touch with the patient . The phone rings twice and then goes to a fast busy signal. No option to leave a message. Pietro Cassis RN will attempt to call from a different line to ensure this is not a spam blocker on the patient phone. If she is unable to get in touch with the patient we will cancel today's scan at [facility] imaging.At this point we do not have time to complete the visit as it has been 17 minutes into a 30 minute visit, and per office protocol this is considered a No Show.

## 2022-08-11 NOTE — Telephone Encounter (Signed)
I have attempted to call the patient to do her 9:30 am scheduled Shared decision Making visit prior to her Screening CT Chest scheduled for today.There has been no answer in 3 attempts. The phone rings twice and then goes directly to a fast busy signal. This has occurred all 3 times I have  called. There is no ability to leave a message. I have reviewed her demographics, and this is the only number listed. She has a CT Scheduled for 1200 today. We will have to cancel this scan if we cannot get in touch with her, otherwise she will have to pay out of pocket for the scan per CMS guidelines

## 2022-08-12 ENCOUNTER — Encounter: Payer: Self-pay | Admitting: Emergency Medicine

## 2022-08-12 NOTE — Telephone Encounter (Signed)
Late add: Attempted to reach patient on 7/17 after Maralyn Sago could not get in touch with pt. Unfortunately, pt could not be reached after several attempts.   Attempted to reach patient today to reschedule. Again, the phone would ring then go to busy signal. Will mail patient a letter to call to reschedule both SDMV and LDCT.

## 2022-11-18 DIAGNOSIS — G4733 Obstructive sleep apnea (adult) (pediatric): Secondary | ICD-10-CM | POA: Insufficient documentation

## 2022-11-18 DIAGNOSIS — O09529 Supervision of elderly multigravida, unspecified trimester: Secondary | ICD-10-CM | POA: Insufficient documentation

## 2022-11-18 MED ORDER — AZELASTINE HCL 0.05 % OP SOLN
OPHTHALMIC | Status: AC
Start: 2022-10-05 — End: 2023-01-03

## 2022-11-18 MED ORDER — LOSARTAN POTASSIUM 25 MG OR TABS
25.0000 mg | ORAL_TABLET | ORAL | Status: AC
Start: 2022-09-14 — End: ?

## 2022-11-18 MED ORDER — CETIRIZINE HCL 10 MG OR TABS
10.0000 mg | ORAL_TABLET | ORAL | Status: AC
Start: 2020-02-19 — End: ?

## 2022-11-18 MED ORDER — TRIAMCINOLONE ACETONIDE 0.5 % EX OINT
TOPICAL_OINTMENT | CUTANEOUS | Status: AC
Start: 2022-07-05 — End: ?

## 2022-11-18 MED ORDER — FLUTICASONE PROPIONATE 50 MCG/ACT NA SUSP
NASAL | Status: AC
Start: 2020-02-19 — End: ?

## 2022-11-18 MED ORDER — MUPIROCIN 2 % EX OINT
TOPICAL_OINTMENT | CUTANEOUS | Status: AC
Start: 2022-01-12 — End: ?

## 2022-11-22 ENCOUNTER — Ambulatory Visit: Payer: No Typology Code available for payment source | Admitting: Surgical Oncology

## 2022-11-22 ENCOUNTER — Encounter (HOSPITAL_BASED_OUTPATIENT_CLINIC_OR_DEPARTMENT_OTHER): Payer: Self-pay | Admitting: Surgical Oncology

## 2022-11-22 VITALS — BP 133/81 | HR 72 | Temp 97.2°F | Resp 16 | Wt 176.5 lb

## 2022-11-22 DIAGNOSIS — Z170 Estrogen receptor positive status [ER+]: Secondary | ICD-10-CM | POA: Insufficient documentation

## 2022-11-22 DIAGNOSIS — C50912 Malignant neoplasm of unspecified site of left female breast: Secondary | ICD-10-CM | POA: Insufficient documentation

## 2022-11-22 NOTE — Patient Instructions (Signed)
Plan of Care:  Return to see Dr Elwin Sleight in clinic in 6 months.    Biopsy of Left breast on November 12th, 2024      Note: It is helpful to notify us of the date of any outside imaging or labs so we can request those records prior to your next visit with Dr Elwin Sleight. Please send [place] a message via MyChart or call Bronson Ing at [phone].      Important Numbers:    - Administrative Coordinator:  Eliezer Bottom           Phone: [phone],  Fax: [phone]  For assistance with obtaining outside records, rescheduling visits.    - Surgery Scheduler:  Andrey Cota            Phone: [phone],  Fax: [phone]  For assistance with surgery scheduling and referrals to [facility] and [facility]. Please note: Lafonda Mosses works on Mon, Wed, Fri.    - Nurse Case Manager:    Baxter Hire, Sr. LVN  Case Manager to:  Dr. Kallie Locks, MD  [facility]  P: [phone]  F: [phone]    Melina Schools, MSN, RN, OCN       Phone: [phone] or send a Mychart Message to Dr Harle Stanford.  Any clinical questions/concerns/symptoms       - Voicemail and MyChart are checked by a nurse during clinic hours: Monday - Friday 8:00 am - 4:30 pm    - After Hours Emergencies: Call Dr Angela Burke office at [phone] or call 911 / go to nearest Emergency Room      Please note that Dr. Elwin Sleight has clinic:   - Monday mornings at [facility] in [place]  - Friday afternoons at [facility], 4th Floor (inside of [facility])

## 2022-11-22 NOTE — Progress Notes (Signed)
Surgical Oncology Office Note    Demographics:  Date of visit: November 22, 2022   Patient Name: Carrie Ellison   Medical Record #: [id number]   DOB: [birth year]  Age: 52 year old  Sex: female    Chief complaint:  left breast carcinoma  Reason for visit: Follow-up     Referred by: Terrial Rhodes, MD  [phone] [place] [address]  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  Patient also had reduction mammoplasties the same time.  Patient's sentinel nodes were negative.  Patient has been on tamoxifen.  Oncotype recurrence score was 23%.  Patient has no complaints at present time patient on a recent mammogram showed density in the left breast in the area of the previously was biopsied and felt that was represent fat necrosis.      Physical Examination:  BP 133/81 (BP Location: Right arm, BP Patient Position: Sitting, BP cuff size: Regular)   Pulse 72   Temp 97.2 F (36.2 C) (Temporal)   Resp 16   Wt 80.1 kg (176 lb 8 oz)   SpO2 99%   GENERAL: Well developed/well nourished and pleasant/cooperative 52 year old female in no acute distress.  HEENT: Normocephalic/atraumatic. Sclerae anicteric with pink conjunctiva. Mucus membranes moist. Oropharynx clear.  LYMPH NODES: No lymphadenopathy of the cervical, supraclavicular, axillary, or inguinal basins bilaterally.  CHEST: Clear and resonant to percussion and auscultation bilaterally  BREASTS:  There are bilateral wise pattern reduction mammoplasty scars.  Was no dominant masses in the right breast.  Of the left breast there is a slight area of thickening in the 4 o'clock position just above the inframammary crease in the left breast area.  This feels like fat necrosis.  HEART: Regular without murmurs  ABDOMEN:  Abdomen soft, non-tender, non-distended without palpable masses or hepatoplenomegaly. No evidence of  porto-systemic varices.  EXTREMITIES:  Full range of motion. No clubbing, cyanosis, or edema.  SKIN:  Negative.  NEURO: Non-focal. Normal motor function and sensation to light touch.  PSYCHIATRIC: Alert and oriented to person, place and time.    Assessment/Plan:  Proceed ultrasound biopsy of nodularity left breast.  Return here in 6 months.      Letitia Caul, MD    Professor of Surgery  Division of Surgical Oncology  [place]  [place] [facility] [place], [place]  [facility]     11/22/22

## 2022-11-24 ENCOUNTER — Encounter (INDEPENDENT_AMBULATORY_CARE_PROVIDER_SITE_OTHER): Payer: No Typology Code available for payment source | Admitting: Surgical Oncology

## 2023-01-31 ENCOUNTER — Ambulatory Visit: Payer: Self-pay | Admitting: Physician Assistant

## 2023-01-31 ENCOUNTER — Other Ambulatory Visit: Payer: Self-pay

## 2023-01-31 NOTE — Telephone Encounter (Signed)
 Chief Complaint: body pain Symptoms: body pain, fever of 104.5, chills, headache Frequency: constant since last night Disposition: [x] ED /[] Urgent Care (no appt availability in office) / [] Appointment(In office/virtual)/ []  [place] [facility]/ [] Home Care/ [] Refused Recommended Disposition /[] [facility]/ []  Follow-up with PCP Additional Notes: Patient and sister called in stating patient is having severe body aches and pains, a fever of 104.5 and is stating she does not want anyone to touch her because of how bad her body hurts. Patient stated she is in the ER now and the wait is 2-3 hours so she was told she could call her PCP to see if they could get her in sooner. After further assessment and evaluation of patient, advised patient to stay at the ER to be evaluated and treated due to severity of stated symptoms. Patient verbalized agreement and understanding.    Copied from CRM [id number]. Topic: Clinical - Red Word Triage >> Jan 31, 2023 12:57 PM Corin V wrote: [facility] that prompted transfer to Nurse Triage: Patient body pain, chills, headache, fever, red nose. She tried to take warm shower and needed family to get her out. She is currently at the ER but it is a 2-3 hour wait. Reason for Disposition  Patient sounds very sick or weak to the triager  Answer Assessment - Initial Assessment Questions 1. ONSET: When did the muscle aches or body pains start?      Last night 2. LOCATION: What part of your body is hurting? (e.g., entire body, arms, legs)      Upper body, shoulders, neck, arm, chest 3. SEVERITY: How bad is the pain? (Scale 1-10; or mild, moderate, severe)   - MILD (1-3): doesn't interfere with normal activities    - MODERATE (4-7): interferes with normal activities or awakens from sleep    - SEVERE (8-10):  excruciating pain, unable to do any normal activities      8/10 4. CAUSE: What do you think is causing the pains?     Headache, pain, don't want to be  touched 5. FEVER: Have you been having fever?     Fever of 104.5 about 9 am this morning 6. OTHER SYMPTOMS: Do you have any other symptoms? (e.g., chest pain, weakness, rash, cold or flu symptoms, weight loss)     cough  Protocols used: Muscle Aches and Body Pain-A-AH

## 2023-05-09 ENCOUNTER — Encounter (HOSPITAL_BASED_OUTPATIENT_CLINIC_OR_DEPARTMENT_OTHER): Payer: Self-pay | Admitting: Surgical Oncology

## 2023-05-09 ENCOUNTER — Ambulatory Visit: Payer: No Typology Code available for payment source | Attending: Surgical Oncology | Admitting: Surgical Oncology

## 2023-05-09 VITALS — BP 134/74 | HR 76 | Temp 97.2°F | Resp 17 | Wt 174.4 lb

## 2023-05-09 DIAGNOSIS — C50912 Malignant neoplasm of unspecified site of left female breast: Secondary | ICD-10-CM | POA: Insufficient documentation

## 2023-05-09 DIAGNOSIS — Z170 Estrogen receptor positive status [ER+]: Secondary | ICD-10-CM | POA: Insufficient documentation

## 2023-05-09 NOTE — Patient Instructions (Signed)
 Plan of Care:  Follow up with medical oncology.    Mammogram will be due in October       Note: It is helpful to notify us  of the date of any outside imaging or labs so we can request those records prior to your next visit with Dr Ricarda Challenger. Please send us  a message via MyChart or call Jodeen Munch at [phone].      Important Numbers:    - Administrative Coordinator:  Lowry Rued           Phone: [phone],  Fax: [phone]  For assistance with obtaining outside records, rescheduling visits.    - Surgery Scheduler:  Silas Drivers            Phone: [phone],  Fax: [phone]  For assistance with surgery scheduling and referrals to [facility] and [facility]. Please note: [facility] works on Mon, Wed, Fri.    - Nurse Case Manager:    Burlene Carpen, Sr. LVN  Case Manager to:  Dr. Anastasio Balsam, MD  [facility]  P: [phone]  F: [phone]   or send a Mychart Message to Dr Reynaldo Cavalier.  Any clinical questions/concerns/symptoms       - Voicemail and MyChart are checked by a nurse during clinic hours: Monday - Friday 8:00 am - 4:30 pm    - After Hours Emergencies: Call Dr Loralyn [place] office at [phone] or call 911 / go to nearest Emergency Room      Please note that Dr. Ricarda Challenger has clinic:   - Monday mornings at [facility] in [place]  - Friday afternoons at [facility], 4th Floor (inside of [facility])

## 2023-05-10 NOTE — Progress Notes (Signed)
 Surgical Oncology Office Note    Demographics:  Date of visit: May 10, 2023   Patient Name: Carrie Ellison   Medical Record #: [id number]   DOB: 1970/05/08  Age: 53 year old  Sex: female    Chief complaint:  Left breast carcinoma  Reason for visit:  Follow-u     Referred by: Angelica Kemp, MD  [phone]  [address]  [place] [address]    History of Present Illness:  Patient is now 3 years post left partial mastectomy for ER+PR negative her 2 Neu negative infiltrative ductal carcinoma.  Patient underwent a mammogram in October as well as ultrasound biopsy of the left breast for benign disease.  Patient will be due for her next mammogram in November 2024.  The patient has no complaints at the present time.      Physical Examination:  BP 134/74 (BP Location: Left arm, BP Patient Position: Sitting, BP cuff size: Regular)   Pulse 76   Temp 97.2 F (36.2 C) (Temporal)   Resp 17   Wt 79.1 kg (174 lb 6.4 oz)   SpO2 99%   GENERAL: Well developed/well nourished and pleasant/cooperative 53 year old female in no acute distress.  HEENT: Normocephalic/atraumatic. Sclerae anicteric with pink conjunctiva. Mucus membranes moist. Oropharynx clear.  LYMPH NODES: No lymphadenopathy of the cervical, supraclavicular, axillary, or inguinal basins bilaterally.  CHEST: Clear and resonant to percussion and auscultation bilaterally  BREASTS: Pendulous without nipple retraction skin thickening or deformity there are postsurgical changes in the left breast with reduction mammoplasty scars bilaterally..  Bimanual examination reveals no dominant masses in the right or left breast.  There has no right or left axillary lymphadenopathy.   HEART: Regular without murmurs  ABDOMEN:  Abdomen soft, non-tender, non-distended without palpable masses or hepatoplenomegaly. No evidence of porto-systemic varices.  EXTREMITIES:  Full range of motion. No clubbing, cyanosis, or edema.  SKIN:  Negative.  NEURO: Non-focal. Normal motor  function and sensation to light touch.  PSYCHIATRIC: Alert and oriented to person, place and time.    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  Continue diagnostic mammograms yearly  Return for breast examination with Dr. Rodell Citrin and if surgical consultation needed referred to Dr. Aloysius Janus, MD    Professor of Surgery  Division of Surgical Oncology  [facility] [place] [facility] [place]  [facility]     05/10/23

## 2023-06-30 ENCOUNTER — Encounter (HOSPITAL_BASED_OUTPATIENT_CLINIC_OR_DEPARTMENT_OTHER): Payer: Self-pay | Admitting: Hospital

## 2023-07-01 ENCOUNTER — Encounter (HOSPITAL_BASED_OUTPATIENT_CLINIC_OR_DEPARTMENT_OTHER): Payer: Self-pay | Admitting: Hospital

## 2023-10-19 IMAGING — DX DG CERVICAL SPINE COMPLETE 4+V
5 series · 5 of 5 positions shown · non-contrast
Comparison: December 11, 2005

CLINICAL DATA: Right shoulder weakness

EXAM:
CERVICAL SPINE - COMPLETE 4+ VIEW

[c-spine lat]
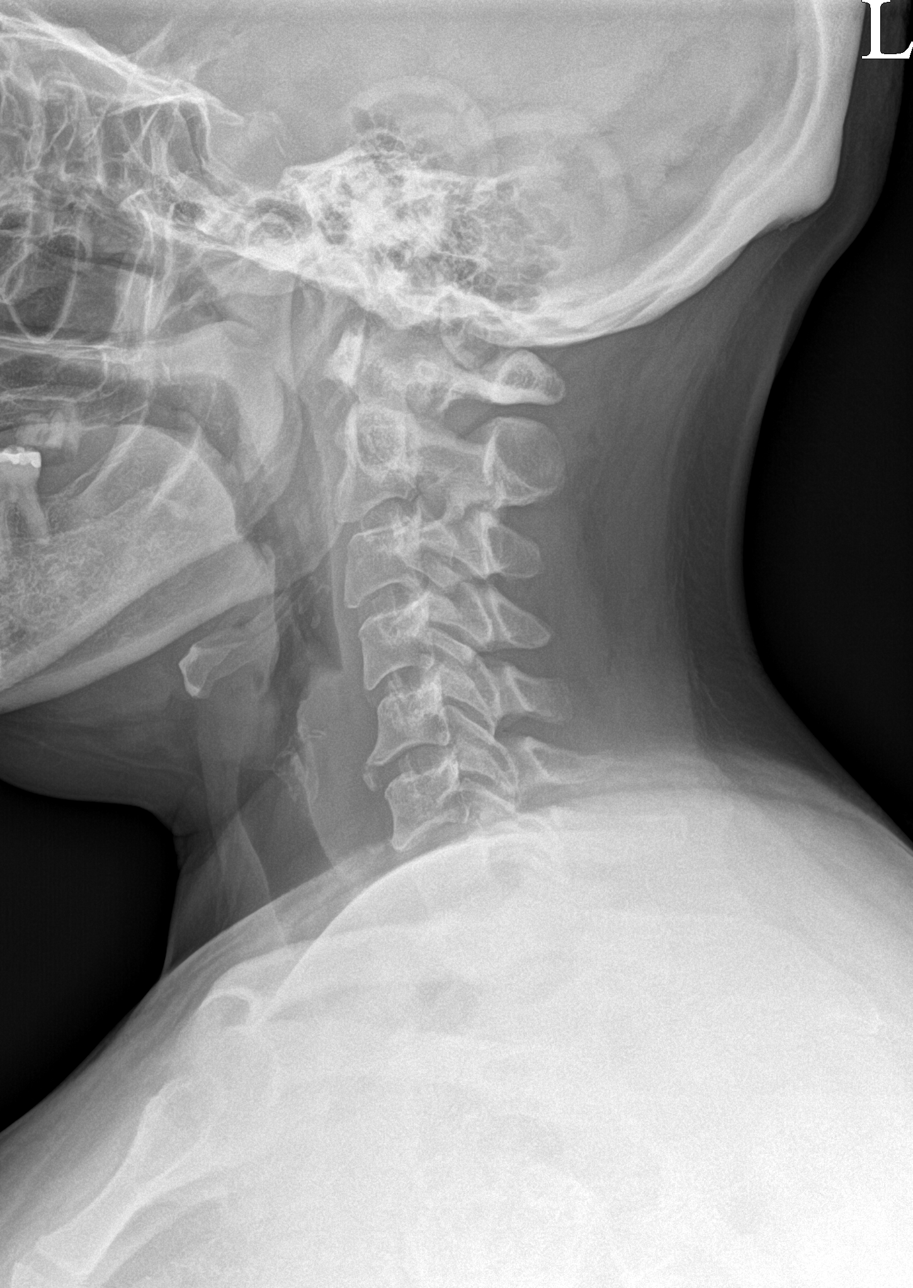

[c-spine obl (1 of 2)]
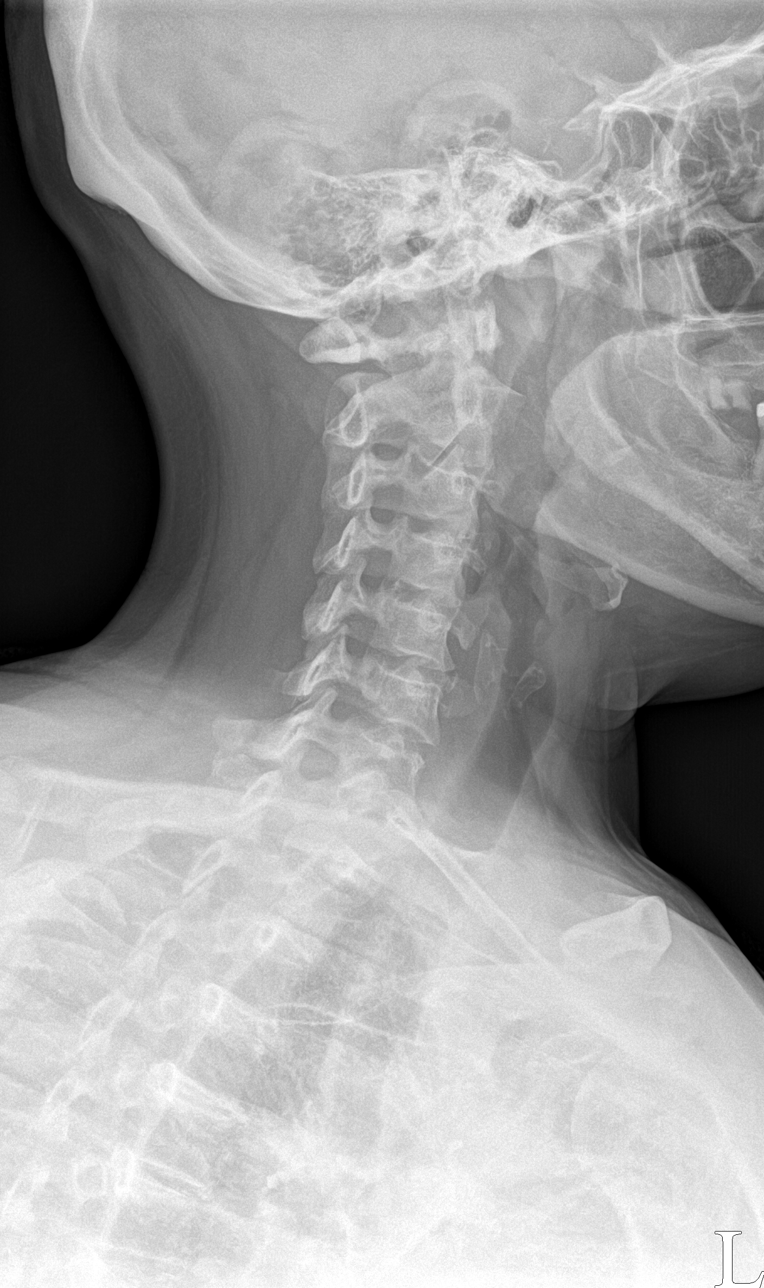

[c-spine obl (2 of 2)]
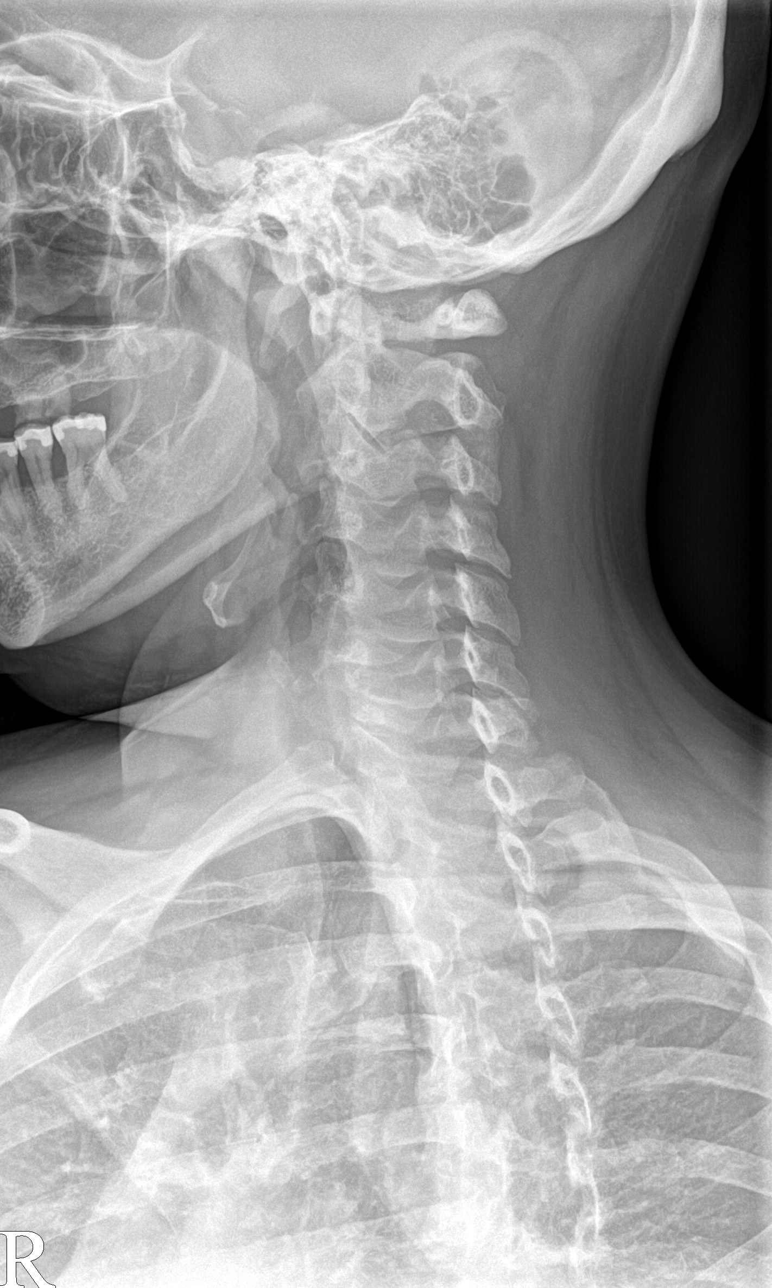

[c-spine ap]
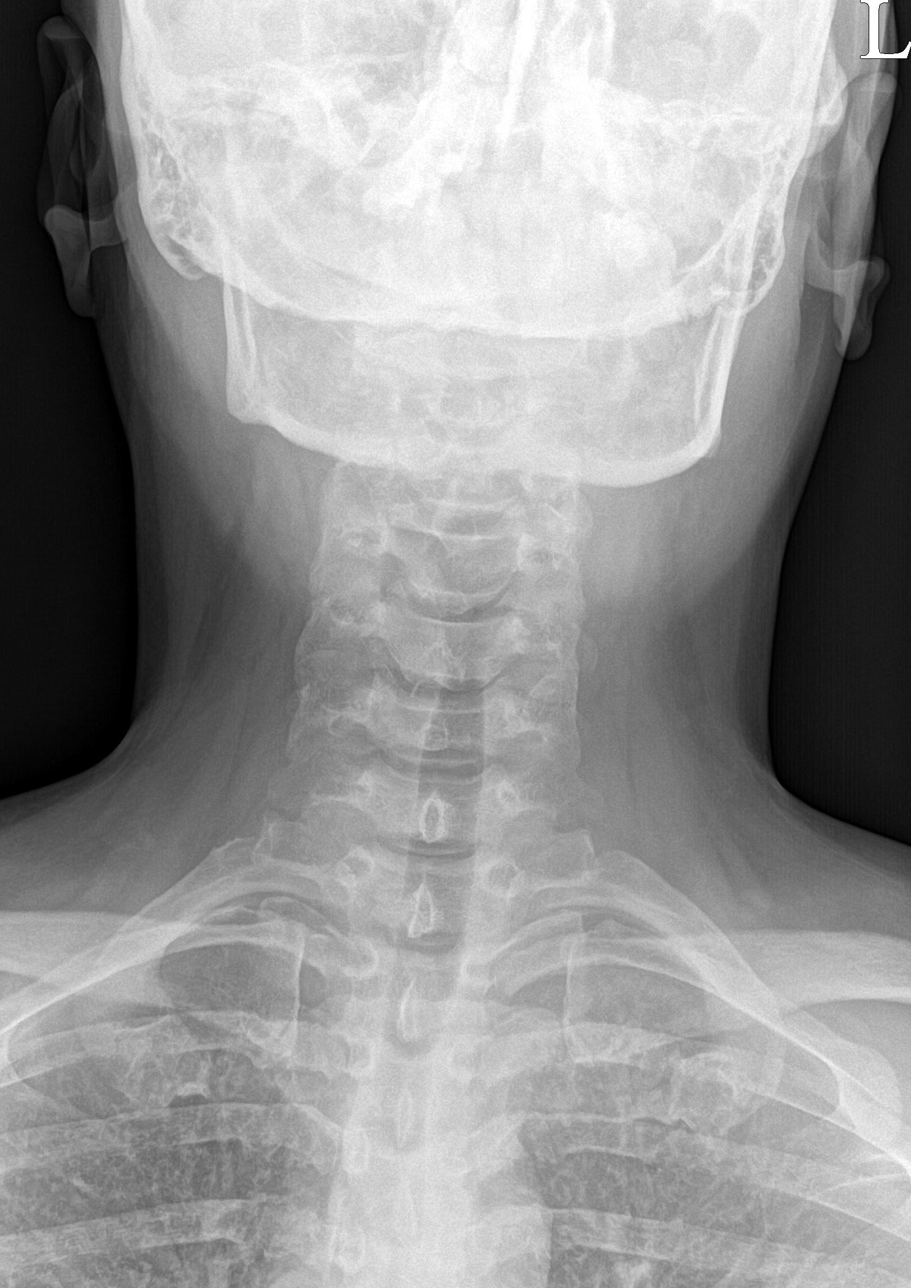

[c-spine open mouth]
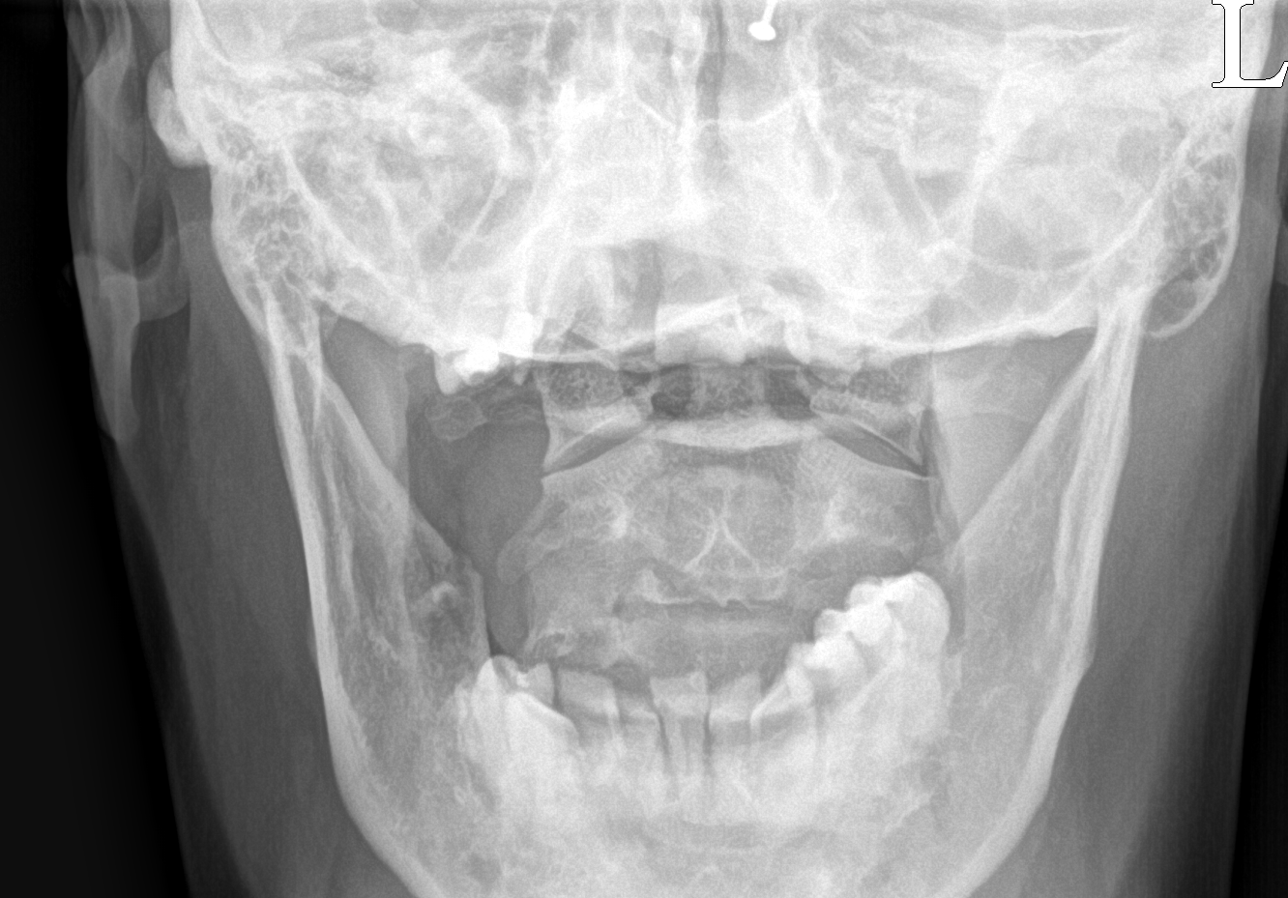

[5 of 5 positions shown; findings below may reference images not displayed]

FINDINGS: The cervical spine is well seen through C6. C7 is largely obscured
by soft tissues. Straightening of normal lordosis. No other
malalignment. No fracture. Degenerative disc disease at C5-6 with an
anterior osteophyte. Pre odontoid space and prevertebral soft
tissues are normal. Neural foramina are normal. No other
abnormalities.
IMPRESSION: Degenerative changes at C5-6 as above. No other significant
abnormalities. The cervical spine is only well seen through the
bottom of C6 and C7 is largely obscured by soft tissues.
